# Patient Record
Sex: Female | Born: 1990 | Race: Black or African American | Hispanic: No | Marital: Married | Smoking: Never smoker
Health system: Southern US, Community
[De-identification: ages and names within clinical notes are randomized; demographics above are authoritative.]

## PROBLEM LIST (undated history)

## (undated) ENCOUNTER — Inpatient Hospital Stay
Discharge: 2017-02-04 | Disposition: A | Discharge status: Home / Self Care | Payer: MEDICAID | Attending: Emergency Medicine

## (undated) DIAGNOSIS — O021 Missed abortion: Secondary | ICD-10-CM

## (undated) DIAGNOSIS — O200 Threatened abortion: Secondary | ICD-10-CM

## (undated) DIAGNOSIS — Z3200 Encounter for pregnancy test, result unknown: Secondary | ICD-10-CM

## (undated) DIAGNOSIS — O26852 Spotting complicating pregnancy, second trimester: Secondary | ICD-10-CM

## (undated) DIAGNOSIS — O034 Incomplete spontaneous abortion without complication: Secondary | ICD-10-CM

## (undated) DIAGNOSIS — O360190 Maternal care for anti-D [Rh] antibodies, unspecified trimester, not applicable or unspecified: Secondary | ICD-10-CM

## (undated) DIAGNOSIS — J45909 Unspecified asthma, uncomplicated: Secondary | ICD-10-CM

## (undated) DIAGNOSIS — D649 Anemia, unspecified: Secondary | ICD-10-CM

## (undated) DIAGNOSIS — E039 Hypothyroidism, unspecified: Secondary | ICD-10-CM

---

## 2015-01-11 LAB — AMB EXT LDL-C: LDL-C, External: 102

## 2015-10-26 DIAGNOSIS — N938 Other specified abnormal uterine and vaginal bleeding: Secondary | ICD-10-CM

## 2015-10-26 NOTE — ED Notes (Signed)
Intermittent vaginal bleeding for approx 2 weeks.  Denies pain at the present time.  POC preg. Test negative

## 2015-10-26 NOTE — ED Notes (Signed)
I have reviewed discharge instructions with the patient.  The patient verbalized understanding.

## 2015-10-26 NOTE — ED Triage Notes (Signed)
Pt in c/o intermittant vaginal bleeding since November 18. Pt states she had her last normal period on November 10 and ended on November 14. Pt states she is spotting some days and not on others. Pt states she is RH negative. Pt states she took pregnancy test and it was negative. Pt denies pain.

## 2015-10-26 NOTE — ED Provider Notes (Signed)
HPI Comments: With last menstrual period November 10.  Has had irregular spotting since then.  Off-and-on.  Denies any pain.  Is on thyroid medication.    Patient is a 24 y.o. female presenting with vaginal bleeding. The history is provided by the patient. No language interpreter was used.   Vaginal Bleeding   This is a new problem. The current episode started 2 days ago. The problem occurs daily. The problem has been resolved. Pertinent negatives include no chest pain, no abdominal pain, no headaches and no shortness of breath. Nothing aggravates the symptoms. Nothing relieves the symptoms. She has tried nothing for the symptoms.        Past Medical History:   Diagnosis Date   ??? Adult hypothyroidism 09/11/2015   ??? Asthma 09/11/2015   ??? BMI 28.0-28.9,adult    ??? Chest pressure    ??? Cough    ??? Dizziness    ??? Fatigue 09/11/2015   ??? Head ache    ??? Left thumb sprain    ??? Sinusitis    ??? Thrombocytopenia (HCC) 09/11/2015       Past Surgical History:   Procedure Laterality Date   ??? Hx cesarean section  2015     X1          Family History:   Problem Relation Age of Onset   ??? Hypertension Mother    ??? Elevated Lipids Brother        Social History     Social History   ??? Marital status: SINGLE     Spouse name: N/A   ??? Number of children: N/A   ??? Years of education: N/A     Occupational History   ??? Not on file.     Social History Main Topics   ??? Smoking status: Never Smoker   ??? Smokeless tobacco: Not on file   ??? Alcohol use No   ??? Drug use: No   ??? Sexual activity: Yes     Other Topics Concern   ??? Not on file     Social History Narrative         ALLERGIES: Suprax [cefixime]    Review of Systems   Constitutional: Negative for chills and fever.   Eyes: Negative for pain and redness.   Respiratory: Negative for chest tightness, shortness of breath and wheezing.    Cardiovascular: Negative for chest pain and leg swelling.   Gastrointestinal: Negative for abdominal pain, diarrhea, nausea and vomiting.    Genitourinary: Positive for vaginal bleeding. Negative for dysuria and vaginal discharge.   Musculoskeletal: Negative for back pain, gait problem, neck pain and neck stiffness.   Skin: Negative for color change and rash.   Neurological: Negative for weakness, numbness and headaches.       Vitals:    10/26/15 2007   BP: 145/73   Pulse: 95   Resp: 19   Temp: 98.2 ??F (36.8 ??C)   SpO2: 100%   Weight: 81.6 kg (180 lb)   Height: 5\' 2"  (1.575 m)            Physical Exam   Constitutional: She is oriented to person, place, and time. She appears well-developed and well-nourished. No distress.   HENT:   Head: Normocephalic and atraumatic.   Neck: Normal range of motion. Neck supple.   Cardiovascular: Normal rate and regular rhythm.    No murmur heard.  Pulmonary/Chest: Effort normal and breath sounds normal. She has no wheezes.   Abdominal: Soft. Bowel sounds  are normal. There is no tenderness.   Musculoskeletal: Normal range of motion. She exhibits no edema.   Neurological: She is alert and oriented to person, place, and time.   Skin: Skin is warm and dry.   Nursing note and vitals reviewed.       MDM  Number of Diagnoses or Management Options  Diagnosis management comments: DUB possibly due to thyroid meds. Will refer to OB/GYN.     Patient Progress  Patient progress: stable    ED Course       Procedures    UA neg. UPT neg.

## 2015-10-27 ENCOUNTER — Inpatient Hospital Stay
Admit: 2015-10-27 | Discharge: 2015-10-27 | Disposition: A | Discharge status: Home / Self Care | Payer: MEDICAID | Attending: Emergency Medicine

## 2015-10-27 LAB — HCG URINE, QL. - POC: Pregnancy test,urine (POC): NEGATIVE

## 2016-04-11 NOTE — Telephone Encounter (Signed)
Patient's phone number is disconnected I could not reschedule her appointment for 04/16/16 Dr. Dalene Carrowdogwu is on vacation.

## 2016-04-16 ENCOUNTER — Encounter: Attending: Obstetrics & Gynecology

## 2016-04-26 ENCOUNTER — Ambulatory Visit: Admit: 2016-04-26 | Discharge: 2016-04-26 | Payer: MEDICAID | Attending: Obstetrics & Gynecology

## 2016-04-26 DIAGNOSIS — Z3492 Encounter for supervision of normal pregnancy, unspecified, second trimester: Secondary | ICD-10-CM

## 2016-04-26 LAB — CBC WITH AUTOMATED DIFF
ABS. GRANULOCYTES: 6.9 10*3/uL (ref 2.0–7.8)
ABS. LYMPHOCYTES: 2.9 10*3/uL (ref 0.6–4.1)
ABSOLUTE MID: 1.2 10*3/uL (ref 0.0–1.8)
GRANULOCYTES: 62.4 % (ref 37.0–92.0)
HCT: 38.5 % (ref 38.0–48.0)
HGB: 12.2 g/dL (ref 11.5–14.5)
LYMPHOCYTES: 26.8 % (ref 10.0–58.8)
MCH: 26.8 pg — ABNORMAL LOW (ref 28.0–35.0)
MCHC: 31.7 g/dL (ref 31.0–36.0)
MCV: 84.6 fL — ABNORMAL LOW (ref 85.4–98.6)
MEAN PLATELET VOLUME: 8.3 fl (ref 0.0–49.9)
MID %: 10.8 % (ref 0.1–15.0)
PLATELET: 201 10*3/uL (ref 150–350)
RBC: 4.55 10*6/uL (ref 4.20–6.30)
RDW: 17.3 % — ABNORMAL HIGH (ref 11.5–14.5)
WBC: 11 10*3/uL — ABNORMAL HIGH (ref 4.1–10.9)

## 2016-04-26 LAB — AMB POC URINALYSIS DIP STICK AUTO W/O MICRO
Bilirubin (UA POC): NEGATIVE
Blood (UA POC): NEGATIVE
Glucose (UA POC): NEGATIVE
Ketones (UA POC): NEGATIVE
Nitrites (UA POC): NEGATIVE
Protein (UA POC): NEGATIVE mg/dL
Specific gravity (UA POC): 1.01 (ref 1.001–1.035)
Urobilinogen (UA POC): NORMAL (ref 0.2–1)
pH (UA POC): 5 (ref 4.6–8.0)

## 2016-04-26 NOTE — Addendum Note (Signed)
Addended byOlin Hauser: Sharone Picchi on: 04/26/2016 04:30 PM      Modules accepted: Orders

## 2016-04-26 NOTE — Progress Notes (Signed)
Prenatal Visit    Ms. Deborah Barker is a 25 y.o. female with an estimated gestational age of [redacted] weeks 3 days with Estimated Date of Delivery: 08/06/16, here for a prenatal visit.   She denies any problems since her last visit to the office. She c/o  pelvic pressure.   Patient denies contractions, vaginal bleeding , vaginal leaking of fluid     She continues to take her prenatal vitamins.    States that the baby has been active.     O:   Vitals:    04/26/16 1558   BP: 130/80   Weight: 197 lb (89.4 kg)      GENERAL APPEARANCE: alert, well appearing, in no apparent distress   Fundus soft, non-tender compatible with gestational age   Cervical exam: not done       A/P:     ICD-10-CM ICD-9-CM    1. Prenatal care in second trimester Z34.92 V22.1 AMB POC URINALYSIS DIP STICK AUTO W/O MICRO      GLUCOSE TOLERANCE, 2 HR   2. Anemia complicating pregnancy in third trimester O99.013 648.23 CBC WITH AUTOMATED DIFF     285.9    3. Thyroid dysfunction in pregnancy, antepartum, third trimester O99.283 648.13 TSH 3RD GENERATION    E07.9 246.9 T4, FREE   .   OB care - Counseled patient about events that should prompt her to go to the Hospital   Follow up in 3 weeks

## 2016-04-29 LAB — T4, FREE: T4, Free: 0.92 ng/dL (ref 0.59–1.17)

## 2016-04-29 LAB — GLUCOSE TOLERANCE, 2 HR
GLUCOSE, FASTING: 80 mg/dl (ref 73–118)
Glucose - 1 hour: 77 mg/dL (ref 73.00–118.00)

## 2016-04-29 LAB — TSH 3RD GENERATION: TSH: 2.66 ng/dL (ref 0.40–4.50)

## 2016-05-21 ENCOUNTER — Inpatient Hospital Stay: Payer: MEDICAID

## 2016-05-21 ENCOUNTER — Ambulatory Visit: Admit: 2016-05-21 | Discharge: 2016-05-21 | Payer: MEDICAID | Attending: Obstetrics & Gynecology

## 2016-05-21 DIAGNOSIS — Z3493 Encounter for supervision of normal pregnancy, unspecified, third trimester: Secondary | ICD-10-CM

## 2016-05-21 LAB — TYPE AND SCREEN
ABO/Rh: A NEG
Antibody Screen: NEGATIVE

## 2016-05-21 LAB — AMB POC URINALYSIS DIP STICK AUTO W/O MICRO
Bilirubin (UA POC): NEGATIVE
Blood (UA POC): NEGATIVE
Glucose (UA POC): NEGATIVE
Ketones (UA POC): NEGATIVE
Leukocyte esterase (UA POC): NEGATIVE
Nitrites (UA POC): NEGATIVE
Protein (UA POC): NEGATIVE mg/dL
Specific gravity (UA POC): 1.03 (ref 1.001–1.035)
Urobilinogen (UA POC): NORMAL (ref 0.2–1)
pH (UA POC): 5 (ref 4.6–8.0)

## 2016-05-21 LAB — TYPE & SCREEN
ABO/Rh(D): A NEG
Antibody screen: NEGATIVE

## 2016-05-21 MED ORDER — RHO D IMMUNE GLOBULIN 300 MCG IM SYRG
1500 unit (300 mcg) | Freq: Once | INTRAMUSCULAR | Status: AC
Start: 2016-05-21 — End: 2016-05-21
  Administered 2016-05-21: 23:00:00 via INTRAMUSCULAR

## 2016-05-21 MED FILL — HYPERRHO S/D 1,500 UNIT (300 MCG) INTRAMUSCULAR SYRINGE: 1500 unit (300 mcg) | INTRAMUSCULAR | Qty: 1

## 2016-05-21 NOTE — Progress Notes (Signed)
Pt received rhogam Im right hip. Pt is stable at time of discharge

## 2016-05-21 NOTE — Progress Notes (Signed)
Prenatal Visit    Ms. Deborah Barker is a 25 y.o. female with an estimated gestational age of 2150w0d with Estimated Date of Delivery: 08/06/16, here for a prenatal visit.   She denies any problems since her last visit to the office. She c/o  pelvic pressure.   Patient denies contractions, vaginal bleeding , vaginal leaking of fluid     She continues to take her prenatal vitamins.    States that the baby has been active.     O:   Vitals:    05/21/16 1509   BP: 122/80   Weight: 198 lb (89.8 kg)      GENERAL APPEARANCE: alert, well appearing, in no apparent distress   Fundus soft, non-tender compatible with gestational age   Cervical exam: not done       A/P:     ICD-10-CM ICD-9-CM    1. Prenatal care in third trimester Z34.93 V22.1 AMB POC URINALYSIS DIP STICK AUTO W/O MICRO   2. Anemia complicating pregnancy in third trimester O99.013 648.23      285.9    .   OB care - Counseled patient about events that should prompt her to go to the Hospital   Follow up in 2 weeks              Antibody scree/rhogam today.              Emphasized importance of continuing ferrous sulphate supplements.

## 2016-05-21 NOTE — Progress Notes (Signed)
Antibody screen drawn and sent to lab.

## 2016-06-04 ENCOUNTER — Ambulatory Visit: Admit: 2016-06-04 | Discharge: 2016-06-04 | Payer: MEDICAID | Attending: Obstetrics & Gynecology

## 2016-06-04 DIAGNOSIS — Z3493 Encounter for supervision of normal pregnancy, unspecified, third trimester: Secondary | ICD-10-CM

## 2016-06-04 LAB — AMB POC URINALYSIS DIP STICK AUTO W/O MICRO
Blood (UA POC): NEGATIVE
Glucose (UA POC): NEGATIVE
Ketones (UA POC): NEGATIVE
Leukocyte esterase (UA POC): NEGATIVE
Nitrites (UA POC): NEGATIVE
Specific gravity (UA POC): 1.02 (ref 1.001–1.035)
Urobilinogen (UA POC): 1 (ref 0.2–1)
pH (UA POC): 5 (ref 4.6–8.0)

## 2016-06-04 MED ORDER — LEVOTHYROXINE 75 MCG TAB
75 mcg | ORAL_TABLET | Freq: Every day | ORAL | 2 refills | Status: DC
Start: 2016-06-04 — End: 2017-01-22

## 2016-06-04 NOTE — Progress Notes (Signed)
Prenatal Visit    Ms. Deborah Barker is a 25 y.o. female with an estimated gestational age of 6581w0d with Estimated Date of Delivery: 08/06/16, here for a prenatal visit.   She denies any problems since her last visit to the office. She c/o  pelvic pressure.   Patient denies contractions, vaginal bleeding , vaginal leaking of fluid     She continues to take her prenatal vitamins.    States that the baby has been active.     O:   Vitals:    06/04/16 1515   BP: 122/76   Weight: 200 lb 6.4 oz (90.9 kg)      GENERAL APPEARANCE: alert, well appearing, in no apparent distress   Fundus soft, non-tender compatible with gestational age   Cervical exam: not done       A/P:     ICD-10-CM ICD-9-CM    1. Prenatal care, third trimester Z34.93 V22.1 AMB POC URINALYSIS DIP STICK AUTO W/O MICRO   2. Anemia complicating pregnancy in third trimester O99.013 648.23      285.9    3. Thyroid dysfunction in pregnancy, antepartum, third trimester O99.283 648.13 levothyroxine (SYNTHROID) 75 mcg tablet    E07.9 246.9    .   OB care - Counseled patient about events that should prompt her to go to the Hospital   Follow up in 2 weeks

## 2016-06-18 ENCOUNTER — Encounter: Payer: MEDICAID | Attending: Obstetrics & Gynecology

## 2016-06-21 ENCOUNTER — Ambulatory Visit: Admit: 2016-06-21 | Discharge: 2016-06-21 | Payer: MEDICAID | Attending: Obstetrics & Gynecology

## 2016-06-21 DIAGNOSIS — Z3493 Encounter for supervision of normal pregnancy, unspecified, third trimester: Secondary | ICD-10-CM

## 2016-06-21 LAB — AMB POC URINALYSIS DIP STICK AUTO W/O MICRO
Bilirubin (UA POC): NEGATIVE
Blood (UA POC): NEGATIVE
Glucose (UA POC): NEGATIVE
Ketones (UA POC): NEGATIVE
Leukocyte esterase (UA POC): NEGATIVE
Nitrites (UA POC): NEGATIVE
Protein (UA POC): NEGATIVE mg/dL
Specific gravity (UA POC): 1.01 (ref 1.001–1.035)
Urobilinogen (UA POC): 0.2 (ref 0.2–1)
pH (UA POC): 6 (ref 4.6–8.0)

## 2016-06-21 NOTE — Progress Notes (Signed)
Prenatal Visit    Ms. Loyal is a 25 y.o. female with an estimated gestational age of [redacted]w[redacted]d with Estimated Date of Delivery: 08/06/16, here for a prenatal visit.   She denies any problems since her last visit to the office. She c/o  pelvic pressure.   Patient denies contractions, vaginal bleeding , vaginal leaking of fluid     She continues to take her prenatal vitamins.    States that the baby has been active.     O:   Vitals:    06/21/16 1301   BP: 120/70   Weight: 197 lb (89.4 kg)      GENERAL APPEARANCE: alert, well appearing, in no apparent distress   Fundus soft, non-tender compatible with gestational age   Cervical exam: not done       A/P:     ICD-10-CM ICD-9-CM    1. Prenatal care in third trimester Z34.93 V22.1 AMB POC URINALYSIS DIP STICK AUTO W/O MICRO   2. Anemia complicating pregnancy in third trimester O99.013 648.23      285.9    .   OB care - Counseled patient about events that should prompt her to go to the Hospital   Follow up in 2 weeks              Emphasized importance of continuing ferrous supplements.

## 2016-07-09 ENCOUNTER — Ambulatory Visit: Admit: 2016-07-09 | Discharge: 2016-07-09 | Payer: MEDICAID | Attending: Obstetrics & Gynecology

## 2016-07-09 DIAGNOSIS — Z3493 Encounter for supervision of normal pregnancy, unspecified, third trimester: Secondary | ICD-10-CM

## 2016-07-09 LAB — AMB POC URINALYSIS DIP STICK AUTO W/O MICRO
Bilirubin (UA POC): NEGATIVE
Blood (UA POC): NEGATIVE
Glucose (UA POC): NEGATIVE
Leukocyte esterase (UA POC): NEGATIVE
Nitrites (UA POC): NEGATIVE
Protein (UA POC): NEGATIVE mg/dL
Specific gravity (UA POC): 1.015 (ref 1.001–1.035)
Urobilinogen (UA POC): 0.2 (ref 0.2–1)
pH (UA POC): 5 (ref 4.6–8.0)

## 2016-07-09 NOTE — Progress Notes (Signed)
Prenatal Visit    Ms. Deborah Barker is a 25 y.o. female with an estimated gestational age of 7834w0d with Estimated Date of Delivery: 08/06/16, here for a prenatal visit.   She denies any problems since her last visit to the office. She c/o  pelvic pressure.   Patient denies contractions, vaginal bleeding , vaginal leaking of fluid     She continues to take her prenatal vitamins.    States that the baby has been active.     O:   Vitals:    07/09/16 1406   BP: 120/82   Weight: 198 lb 3.2 oz (89.9 kg)      GENERAL APPEARANCE: alert, well appearing, in no apparent distress   Fundus soft, non-tender compatible with gestational age   Cervical exam: not done       A/P:     ICD-10-CM ICD-9-CM    1. Prenatal care in third trimester Z34.93 V22.1 AMB POC URINALYSIS DIP STICK AUTO W/O MICRO   2. Anemia complicating pregnancy in third trimester O99.013 648.23      285.9    .   OB care - Counseled patient about events that should prompt her to go to the Hospital   Follow up in 1 weeks             Emphasized importance of continuing ferrous sulphate supplements.

## 2016-07-09 NOTE — Addendum Note (Signed)
Addended by: Oretha MilchSLAUGHTER, Macario Shear on: 07/09/2016 02:30 PM      Modules accepted: Orders

## 2016-07-13 LAB — CULTURE, GENITAL GROUP B STREP
STREP GP B CULTURE: POSITIVE — AB
STREP GP B CULTURE: POSITIVE — AB

## 2016-07-16 ENCOUNTER — Ambulatory Visit: Admit: 2016-07-16 | Discharge: 2016-07-16 | Payer: MEDICAID | Attending: Obstetrics & Gynecology

## 2016-07-16 DIAGNOSIS — Z3493 Encounter for supervision of normal pregnancy, unspecified, third trimester: Secondary | ICD-10-CM

## 2016-07-16 LAB — AMB POC URINALYSIS DIP STICK AUTO W/O MICRO
Bilirubin (UA POC): NEGATIVE
Blood (UA POC): NEGATIVE
Glucose (UA POC): NEGATIVE
Leukocyte esterase (UA POC): NEGATIVE
Nitrites (UA POC): NEGATIVE
Protein (UA POC): NEGATIVE mg/dL
Specific gravity (UA POC): 1.015 (ref 1.001–1.035)
Urobilinogen (UA POC): 0.2 (ref 0.2–1)
pH (UA POC): 6 (ref 4.6–8.0)

## 2016-07-16 NOTE — Progress Notes (Signed)
Prenatal Visit    Ms. Deborah Barker is a 25 y.o. female with an estimated gestational age of 686w0d with Estimated Date of Delivery: 08/06/16, here for a prenatal visit.   She denies any problems since her last visit to the office. She c/o  pelvic pressure.   Patient denies contractions, vaginal bleeding , vaginal leaking of fluid     She continues to take her prenatal vitamins.    States that the baby has been active.     O:   Vitals:    07/16/16 1424   BP: 112/78   Weight: 197 lb (89.4 kg)      GENERAL APPEARANCE: alert, well appearing, in no apparent distress   Fundus soft, non-tender compatible with gestational age   Cervical exam: not done       A/P:     ICD-10-CM ICD-9-CM    1. Prenatal care in third trimester Z34.93 V22.1 AMB POC URINALYSIS DIP STICK AUTO W/O MICRO   2. Anemia complicating pregnancy in third trimester O99.013 648.23      285.9    .   OB care - Counseled patient about events that should prompt her to go to the Hospital   Follow up in 1 weeks              Emphasized importance of continuing ferrous sulphate supplements.              Repeat C/s scheduled for 39 weeks.

## 2016-07-23 ENCOUNTER — Ambulatory Visit: Admit: 2016-07-23 | Discharge: 2016-07-23 | Payer: MEDICAID | Attending: Obstetrics & Gynecology

## 2016-07-23 DIAGNOSIS — Z3493 Encounter for supervision of normal pregnancy, unspecified, third trimester: Secondary | ICD-10-CM

## 2016-07-23 LAB — AMB POC URINALYSIS DIP STICK AUTO W/O MICRO
Bilirubin (UA POC): NEGATIVE
Blood (UA POC): NEGATIVE
Glucose (UA POC): NEGATIVE
Ketones (UA POC): NEGATIVE
Leukocyte esterase (UA POC): NEGATIVE
Nitrites (UA POC): NEGATIVE
Protein (UA POC): NEGATIVE mg/dL
Specific gravity (UA POC): 1.02 (ref 1.001–1.035)
Urobilinogen (UA POC): 0.2 (ref 0.2–1)
pH (UA POC): 5 (ref 4.6–8.0)

## 2016-07-23 NOTE — Progress Notes (Signed)
Prenatal Visit    Ms. Deborah Barker is a 25 y.o. female with an estimated gestational age of 4977w0d with Estimated Date of Delivery: 08/06/16, here for a prenatal visit.   She denies any problems since her last visit to the office. She c/o  pelvic pressure.   Patient denies contractions, vaginal bleeding , vaginal leaking of fluid     She continues to take her prenatal vitamins.    States that the baby has been active.     O:   Vitals:    07/23/16 1412   BP: 110/82   Weight: 199 lb (90.3 kg)      GENERAL APPEARANCE: alert, well appearing, in no apparent distress   Fundus soft, non-tender compatible with gestational age   Cervical exam: not done       A/P:     ICD-10-CM ICD-9-CM    1. Prenatal care in third trimester Z34.93 V22.1 AMB POC URINALYSIS DIP STICK AUTO W/O MICRO   2. Previous cesarean delivery affecting pregnancy O34.219 654.20    .   OB care - Counseled patient about events that should prompt her to go to the Hospital   C/s scheduled for next week.

## 2016-07-24 NOTE — Progress Notes (Signed)
Patient ID verified.  Allergies, medical history, prenatal record and prior to admission medications verified.  Pt instructed to be NPO after midnight.  Pt instructed to arrive at hospital @1045,come to entrance C and sign in at the registration desk on the 4th floor. Patient instructed to come to hospital sooner if SROM, labor, or concerning symptoms. Patient verbalized understanding. Questions encouraged and answered.        Patient's prenatal record and scheduled delivery form have been scanned into connect care.  Results console and orders have been placed in connect care.

## 2016-07-25 NOTE — Progress Notes (Signed)
Patient moved to 08/01/2016 @ 1300. Patient notified and verbalizes understanding.

## 2016-08-01 ENCOUNTER — Encounter

## 2016-08-01 ENCOUNTER — Inpatient Hospital Stay
Admit: 2016-08-01 | Discharge: 2016-08-04 | Disposition: A | Discharge status: Home / Self Care | Payer: MEDICAID | Attending: Obstetrics & Gynecology | Admitting: Obstetrics & Gynecology

## 2016-08-01 DIAGNOSIS — O34219 Maternal care for unspecified type scar from previous cesarean delivery: Secondary | ICD-10-CM

## 2016-08-01 LAB — TYPE AND SCREEN
ABO/Rh: A NEG
Antibody Screen: NEGATIVE

## 2016-08-01 LAB — CBC W/O DIFF
HCT: 38.9 % (ref 35.8–46.3)
HGB: 12.9 g/dL (ref 11.7–15.4)
MCH: 27.2 PG (ref 26.1–32.9)
MCHC: 33.2 g/dL (ref 31.4–35.0)
MCV: 82.1 FL (ref 79.6–97.8)
MPV: 12.2 FL (ref 10.8–14.1)
PLATELET: 130 10*3/uL — ABNORMAL LOW (ref 150–450)
RBC: 4.74 M/uL (ref 4.05–5.25)
RDW: 15.2 % — ABNORMAL HIGH (ref 11.9–14.6)
WBC: 7.8 10*3/uL (ref 4.3–11.1)

## 2016-08-01 LAB — CORD BLOOD GAS VENOUS
BASE DEFICIT,CBV: 1.5 mmol/L — ABNORMAL LOW (ref 1.9–7.7)
HCO3,CORD BLD VENOUS: 22 mmol/L
PCO2,CORD BLD VENOUS: 35 mmHg (ref 14.1–43.3)
PH,CORD BLD VENOUS: 7.423 (ref 7.2–7.44)
PO2,CORD BLD VENOUS: 32 mmHg (ref 30.4–57.2)

## 2016-08-01 LAB — RT--CORD BLOOD GAS
BASE DEFICIT,CBA: 0.1 mmol/L (ref 0.0–2.0)
HCO3,CORD BLD ARTERIAL: 26 mmol/L (ref 22–26)
PCO2,CORD BLD ARTERIAL: 46 mmHg (ref 33–49)
PH,CORD BLD ARTERIAL: 7.364 — ABNORMAL HIGH (ref 7.21–7.31)
PO2,CORD BLD ARTERIAL: 19 mmHg (ref 9–19)

## 2016-08-01 LAB — TYPE & SCREEN
ABO/Rh(D): A NEG
Antibody screen: NEGATIVE

## 2016-08-01 MED ORDER — ONDANSETRON (PF) 4 MG/2 ML INJECTION
4 mg/2 mL | INTRAMUSCULAR | Status: AC
Start: 2016-08-01 — End: ?

## 2016-08-01 MED ORDER — SODIUM CHLORIDE 0.9 % IJ SYRG
INTRAMUSCULAR | Status: DC | PRN
Start: 2016-08-01 — End: 2016-08-01

## 2016-08-01 MED ORDER — KETOROLAC TROMETHAMINE 30 MG/ML INJECTION
30 mg/mL (1 mL) | INTRAMUSCULAR | Status: DC | PRN
Start: 2016-08-01 — End: 2016-08-01
  Administered 2016-08-01: 18:00:00 via INTRAVENOUS

## 2016-08-01 MED ORDER — LACTATED RINGERS IV
INTRAVENOUS | Status: DC
Start: 2016-08-01 — End: 2016-08-01

## 2016-08-01 MED ORDER — OXYTOCIN 30 UNIT IN 500 ML INFUSION
30 unit/500 mL | Freq: Once | INTRAVENOUS | Status: DC
Start: 2016-08-01 — End: 2016-08-01

## 2016-08-01 MED ORDER — SODIUM CHLORIDE 0.9 % IJ SYRG
Freq: Three times a day (TID) | INTRAMUSCULAR | Status: DC
Start: 2016-08-01 — End: 2016-08-04
  Administered 2016-08-04: 02:00:00 via INTRAVENOUS

## 2016-08-01 MED ORDER — SODIUM CHLORIDE 0.9 % INJECTION
10 mg/mL | INTRAMUSCULAR | Status: DC | PRN
Start: 2016-08-01 — End: 2016-08-01
  Administered 2016-08-01: 17:00:00 via INTRAVENOUS

## 2016-08-01 MED ORDER — SODIUM CITRATE-CITRIC ACID 500 MG-334 MG/5 ML ORAL SOLN
500-334 mg/5 mL | Freq: Once | ORAL | Status: AC
Start: 2016-08-01 — End: 2016-08-01
  Administered 2016-08-01: 17:00:00 via ORAL

## 2016-08-01 MED ORDER — PHENYLEPHRINE 10 MG/ML INJECTION
10 mg/mL | INTRAMUSCULAR | Status: AC
Start: 2016-08-01 — End: ?

## 2016-08-01 MED ORDER — OXYCODONE 5 MG TAB
5 mg | Freq: Four times a day (QID) | ORAL | Status: DC | PRN
Start: 2016-08-01 — End: 2016-08-02

## 2016-08-01 MED ORDER — HYDROMORPHONE (PF) 1 MG/ML IJ SOLN
1 mg/mL | INTRAMUSCULAR | Status: DC | PRN
Start: 2016-08-01 — End: 2016-08-02

## 2016-08-01 MED ORDER — BUPIVACAINE-DEXTROSE-WATER(PF) 7.5 MG/ML (0.75 %) (SPINAL) IJ SOLN
0.75 % (7.5 mg/mL) | INTRAMUSCULAR | Status: AC
Start: 2016-08-01 — End: ?

## 2016-08-01 MED ORDER — LACTATED RINGERS IV
INTRAVENOUS | Status: DC | PRN
Start: 2016-08-01 — End: 2016-08-01
  Administered 2016-08-01 (×2): via INTRAVENOUS

## 2016-08-01 MED ORDER — MORPHINE (PF) 0.5 MG/ML IJ SOLN
0.5 mg/mL | INTRAMUSCULAR | Status: DC | PRN
Start: 2016-08-01 — End: 2016-08-01
  Administered 2016-08-01: 17:00:00 via INTRATHECAL

## 2016-08-01 MED ORDER — FENTANYL CITRATE (PF) 50 MCG/ML IJ SOLN
50 mcg/mL | INTRAMUSCULAR | Status: DC | PRN
Start: 2016-08-01 — End: 2016-08-01
  Administered 2016-08-01: 17:00:00 via INTRATHECAL

## 2016-08-01 MED ORDER — DEXTROSE 5%-LACTATED RINGERS IV
INTRAVENOUS | Status: DC
Start: 2016-08-01 — End: 2016-08-01

## 2016-08-01 MED ORDER — HYDROMORPHONE (PF) 2 MG/ML IJ SOLN
2 mg/mL | INTRAMUSCULAR | Status: DC | PRN
Start: 2016-08-01 — End: 2016-08-02

## 2016-08-01 MED ORDER — EPHEDRINE SULFATE 50 MG/ML IJ SOLN
50 mg/mL | INTRAMUSCULAR | Status: AC
Start: 2016-08-01 — End: ?

## 2016-08-01 MED ORDER — KETOROLAC TROMETHAMINE 30 MG/ML INJECTION
30 mg/mL (1 mL) | INTRAMUSCULAR | Status: AC
Start: 2016-08-01 — End: ?

## 2016-08-01 MED ORDER — HYDROCODONE-ACETAMINOPHEN 5 MG-325 MG TAB
5-325 mg | ORAL | Status: DC | PRN
Start: 2016-08-01 — End: 2016-08-02

## 2016-08-01 MED ORDER — ONDANSETRON (PF) 4 MG/2 ML INJECTION
4 mg/2 mL | Freq: Four times a day (QID) | INTRAMUSCULAR | Status: DC | PRN
Start: 2016-08-01 — End: 2016-08-02
  Administered 2016-08-01: 23:00:00 via INTRAVENOUS

## 2016-08-01 MED ORDER — OXYTOCIN 30 UNIT IN 500 ML INFUSION
30 unit/500 mL | INTRAVENOUS | Status: DC | PRN
Start: 2016-08-01 — End: 2016-08-01
  Administered 2016-08-01: 18:00:00 via INTRAVENOUS

## 2016-08-01 MED ORDER — LACTATED RINGERS IV
INTRAVENOUS | Status: DC
Start: 2016-08-01 — End: 2016-08-02

## 2016-08-01 MED ORDER — BUPIVACAINE-DEXTROSE-WATER(PF) 7.5 MG/ML (0.75 %) (SPINAL) IJ SOLN
0.75 % (7.5 mg/mL) | INTRAMUSCULAR | Status: DC | PRN
Start: 2016-08-01 — End: 2016-08-01
  Administered 2016-08-01: 17:00:00 via INTRATHECAL

## 2016-08-01 MED ORDER — CEFAZOLIN 2 GRAM/50 ML NS IVPB
Freq: Once | INTRAVENOUS | Status: AC
Start: 2016-08-01 — End: 2016-08-02

## 2016-08-01 MED ORDER — ONDANSETRON (PF) 4 MG/2 ML INJECTION
4 mg/2 mL | INTRAMUSCULAR | Status: DC | PRN
Start: 2016-08-01 — End: 2016-08-01
  Administered 2016-08-01: 18:00:00 via INTRAVENOUS

## 2016-08-01 MED ORDER — OXYCODONE 5 MG TAB
5 mg | Freq: Once | ORAL | Status: DC | PRN
Start: 2016-08-01 — End: 2016-08-02

## 2016-08-01 MED ORDER — SODIUM CHLORIDE 0.9 % IJ SYRG
Freq: Three times a day (TID) | INTRAMUSCULAR | Status: DC
Start: 2016-08-01 — End: 2016-08-01

## 2016-08-01 MED ORDER — FAMOTIDINE (PF) 20 MG/2 ML IV
20 mg/2 mL | Freq: Once | INTRAVENOUS | Status: AC
Start: 2016-08-01 — End: 2016-08-01
  Administered 2016-08-01: 17:00:00 via INTRAVENOUS

## 2016-08-01 MED ORDER — DIPHTH,PERTUS(AC)TETANUS VAC(PF) 2.5 LF UNIT-8 MCG-5 LF/0.5 ML INJ
INTRAMUSCULAR | Status: DC
Start: 2016-08-01 — End: 2016-08-01

## 2016-08-01 MED ORDER — MORPHINE (PF) 0.5 MG/ML IJ SOLN
0.5 mg/mL | INTRAMUSCULAR | Status: AC
Start: 2016-08-01 — End: ?

## 2016-08-01 MED ORDER — NALOXONE 0.4 MG/ML INJECTION
0.4 mg/mL | Freq: Once | INTRAMUSCULAR | Status: DC | PRN
Start: 2016-08-01 — End: 2016-08-02

## 2016-08-01 MED ORDER — KETOROLAC TROMETHAMINE 30 MG/ML INJECTION
30 mg/mL (1 mL) | Freq: Four times a day (QID) | INTRAMUSCULAR | Status: DC | PRN
Start: 2016-08-01 — End: 2016-08-02
  Administered 2016-08-02: 05:00:00 via INTRAVENOUS

## 2016-08-01 MED ORDER — CEFAZOLIN 1 GRAM SOLUTION FOR INJECTION
1 gram | INTRAMUSCULAR | Status: DC | PRN
Start: 2016-08-01 — End: 2016-08-01
  Administered 2016-08-01: 17:00:00 via INTRAVENOUS

## 2016-08-01 MED ORDER — FENTANYL CITRATE (PF) 50 MCG/ML IJ SOLN
50 mcg/mL | INTRAMUSCULAR | Status: AC
Start: 2016-08-01 — End: ?

## 2016-08-01 MED ORDER — SODIUM CHLORIDE 0.9 % IJ SYRG
INTRAMUSCULAR | Status: DC | PRN
Start: 2016-08-01 — End: 2016-08-04

## 2016-08-01 MED ORDER — SODIUM CHLORIDE 0.9 % INJECTION
25 mg/mL | Freq: Four times a day (QID) | INTRAMUSCULAR | Status: DC | PRN
Start: 2016-08-01 — End: 2016-08-02
  Administered 2016-08-02: via INTRAVENOUS

## 2016-08-01 MED FILL — KETOROLAC TROMETHAMINE 30 MG/ML INJECTION: 30 mg/mL (1 mL) | INTRAMUSCULAR | Qty: 1

## 2016-08-01 MED FILL — HYDROMORPHONE (PF) 2 MG/ML IJ SOLN: 2 mg/mL | INTRAMUSCULAR | Qty: 1

## 2016-08-01 MED FILL — ONDANSETRON (PF) 4 MG/2 ML INJECTION: 4 mg/2 mL | INTRAMUSCULAR | Qty: 2

## 2016-08-01 MED FILL — ONDANSETRON (PF) 4 MG/2 ML INJECTION: 4 mg/2 mL | INTRAMUSCULAR | Qty: 4

## 2016-08-01 MED FILL — MARCAINE SPINAL (PF) 0.75 % (7.5 MG/ML) INJECTION SOLUTION: 0.75 % (7.5 mg/mL) | INTRAMUSCULAR | Qty: 1.8

## 2016-08-01 MED FILL — FAMOTIDINE (PF) 20 MG/2 ML IV: 20 mg/2 mL | INTRAVENOUS | Qty: 2

## 2016-08-01 MED FILL — PHENYLEPHRINE 10 MG/ML INJECTION: 10 mg/mL | INTRAMUSCULAR | Qty: 100

## 2016-08-01 MED FILL — PHENYLEPHRINE 10 MG/ML INJECTION: 10 mg/mL | INTRAMUSCULAR | Qty: 1

## 2016-08-01 MED FILL — MORPHINE (PF) 0.5 MG/ML IJ SOLN: 0.5 mg/mL | INTRAMUSCULAR | Qty: 10

## 2016-08-01 MED FILL — MARCAINE SPINAL (PF) 0.75 % (7.5 MG/ML) INJECTION SOLUTION: 0.75 % (7.5 mg/mL) | INTRAMUSCULAR | Qty: 2

## 2016-08-01 MED FILL — FENTANYL CITRATE (PF) 50 MCG/ML IJ SOLN: 50 mcg/mL | INTRAMUSCULAR | Qty: 2

## 2016-08-01 MED FILL — EPHEDRINE SULFATE 50 MG/ML IJ SOLN: 50 mg/mL | INTRAMUSCULAR | Qty: 1

## 2016-08-01 MED FILL — LACTATED RINGERS IV: INTRAVENOUS | Qty: 2000

## 2016-08-01 MED FILL — OXYTOCIN 30 UNIT IN 500 ML INFUSION: 30 unit/500 mL | INTRAVENOUS | Qty: 500

## 2016-08-01 MED FILL — SODIUM CITRATE-CITRIC ACID 500 MG-334 MG/5 ML ORAL SOLN: 500-334 mg/5 mL | ORAL | Qty: 2

## 2016-08-01 MED FILL — CEFAZOLIN 1 GRAM SOLUTION FOR INJECTION: 1 gram | INTRAMUSCULAR | Qty: 2

## 2016-08-01 NOTE — Anesthesia Procedure Notes (Signed)
Spinal Block    Start time: 08/01/2016 1:12 PM  End time: 08/01/2016 1:13 PM  Performed by: Derry LoryHORTON JR, Hartley Wyke H  Authorized by: Delman KittenHORTON JR, Shandrea Lusk H     Pre-procedure:  Indications: at surgeon's request and primary anesthetic  Preanesthetic Checklist: patient identified, risks and benefits discussed, anesthesia consent, site marked, patient being monitored and timeout performed    Timeout Time: 13:11          Spinal Block:   Patient Position:  Seated  Prep Region:  Lumbar  Prep: DuraPrep      Location:  L2-3  Technique:  Single shot  Local:  Lidocaine 1%  Local Dose (mL):  3    Needle:   Needle Type:  Pencan  Needle Gauge:  25 G  Attempts:  1      Events: CSF confirmed, no blood with aspiration and no paresthesia        Assessment:  Insertion:  Uncomplicated  Patient tolerance:  Patient tolerated the procedure well with no immediate complications

## 2016-08-01 NOTE — Op Note (Signed)
Cesarean Section Delivery Operative Report     Date of Surgery: 08/01/2016     Preoperative Diagnosis: H/O cesarean section [Z98.891]  [redacted] weeks gestation of pregnancy [Z3A.39]    Postoperative Diagnosis: same with viable infant    Procedure: Procedure(s):  CESAREAN SECTION    Surgeon(s) and Role:     * Cranston Neighbor, MD - Primary     Anesthesia: Spinal    Procedure Detail:    The patient was taken to the operating room, where spinal anesthesia was found to be adequate.  The patient was prepped and draped in the normal sterile fashion. Pfannenstiel skin incision was made with the scalpel and carried down to the underlying fascia. The fascial incision was extended laterally with Mayo scissors. This fascial incision was grasped with Kocher clamps, tented up, and the underlying rectus muscles were dissected bluntly. The inferior edge of the rectus fascia was grasped with Kocher clamps, tented up, and the underlying rectus muscle was dissected off bluntly. The rectus muscle was divided in the midline bluntly. The peritoneum was entered bluntly with hemostat and extended inferiorly and superiorly with Metzenbaum scissors. The bladder blade was then inserted. The vesicouterine and peritoneum was identified, grasped with pick-ups and entered sharply with Metzenbaum scissors. The bladder flap was then created digitally and the bladder blade was reinserted. A low transverse uterine incision was made with the scalpel and extended laterally with blunt finger dissection. The baby???s head was then delivered atraumatically. The nose and mouth were suctioned. The cord was clamped and cut and the baby was handed off to the waiting neonatal care unit staff. Placenta was then delivered spontaneously. The uterus was exteriorized and cleared of all clots and debris. The uterine incision was closed in one layer with 0 Vicryl with good hemostasis assured. The pelvis was washed with warm normal saline. Good hemostasis was again reassured.   The paracolic gutters were washed with warm normal saline and the uterus was returned to the abdomen. Good hemostasis was again reassured throughout.  The peritoneum was closed with 2-0 chromic in a running fashion. The fascia was closed with 0 Vicryl in a running fashion. Good hemostasis was assured. The skin was closed with staples. The patient tolerated the procedure well. Sponge, lap, and needle counts were correct times two and the patient was taken to recovery in stable condition.      Estimated Blood Loss:  800 mls    Specimens:   ID Type Source Tests Collected by Time Destination   1 : placenta Placenta Placenta  Cranston Neighbor, MD 08/01/2016 1223 Discarded        Cord Blood Results:  Information for the patient's newborn:  Soriah, Leeman [[id number]]     Lab Results   Component Value Date/Time    DAT IgG NEG 08/01/2016 01:30 PM    ABO/Rh(D) A POSITIVE 08/01/2016 01:30 PM     Lab Results   Component Value Date/Time    PH,CORD BLD ARTERIAL 7.364 08/01/2016 01:30 PM    PCO2,CORD BLD ARTERIAL 46 08/01/2016 01:30 PM    PO2,CORD BLD ARTERIAL 19 08/01/2016 01:30 PM    HCO3,CORD BLD ARTERIAL 26 08/01/2016 01:30 PM    BASE DEFICIT,CBA 0.1 08/01/2016 01:30 PM    SITE CORD 08/01/2016 01:30 PM    SITE CORD 08/01/2016 01:30 PM    Respiratory comment: na at 9 7 2017 1 42 37 PM. Not read back. 08/01/2016 01:30 PM    Respiratory comment: na at 9 7  2017 1 43 06 PM. Not read back. 08/01/2016 01:30 PM        Prenatal Labs:  Lab Results   Component Value Date/Time    ABO/Rh(D) A NEGATIVE 08/01/2016 12:00 PM        Signed By:  Cranston Neighbor, MD     August 01, 2016

## 2016-08-01 NOTE — Progress Notes (Signed)
Pharmacy contacted, denise. May give ancef with suprax allergy- rash

## 2016-08-01 NOTE — H&P (Signed)
History & Physical    Name: Deborah Barker MRN: [id number]  SSN: [id number]    Date of Birth: February 01, 1991  Age: 25 y.o.  Sex: female      Subjective:     Estimated Date of Delivery: 08/06/16  OB History   Gravida Para Term Preterm AB Living   2 1 1   1    SAB TAB Ectopic Molar Multiple Live Births        1      # Outcome Date GA Lbr Len/2nd Weight Sex Delivery Anes PTL Lv   2 Current            1 Term 05/24/14    F  EPI N       Complications: Failure to Progress in Second Stage          Deborah Barker admitted with pregnancy at [redacted]w[redacted]d for cesarean section due to previous cesarean section. Prenatal course was normal. Please see prenatal records for details.    Past Medical History:   Diagnosis Date   ??? Adult hypothyroidism 09/11/2015   ??? Asthma 09/11/2015   ??? BMI 28.0-28.9,adult    ??? Chest pressure    ??? Cough    ??? Dizziness    ??? Fatigue 09/11/2015   ??? Head ache    ??? Left thumb sprain    ??? Sinusitis    ??? Thrombocytopenia (HCC) 09/11/2015     Past Surgical History:   Procedure Laterality Date   ??? HX CESAREAN SECTION  2015    X1      Social History     Occupational History   ??? Not on file.     Social History Main Topics   ??? Smoking status: Never Smoker   ??? Smokeless tobacco: Never Used   ??? Alcohol use No   ??? Drug use: No   ??? Sexual activity: Yes     Family History   Problem Relation Age of Onset   ??? Hypertension Mother    ??? Elevated Lipids Brother        Allergies   Allergen Reactions   ??? Nickel Rash   ??? Suprax [Cefixime] Rash     *CEPHALOSPORINS*  Per mother as a child     Prior to Admission medications    Medication Sig Start Date End Date Taking? Authorizing Provider   PNV No12-Iron-FA-DSS-OM-3 29 mg iron-1 mg -50 mg CPKD Take  by mouth.   Yes Historical Provider   levothyroxine (SYNTHROID) 75 mcg tablet Take 1 Tab by mouth Daily (before breakfast). Indications: hypothyroidism 06/04/16  Yes Macey Wurtz H Mykel Sponaugle, MD   ferrous sulfate 325 mg (65 mg iron) tablet  02/14/16  Yes Historical Provider         Review of Systems: A comprehensive review of systems was negative except for that written in the History of Present Illness.    Objective:     Vitals:  Vitals:    08/01/16 1139 08/01/16 1141   BP:  133/75   Pulse:  (!) 104   Resp:  18   Temp:  98.5 ??F (36.9 ??C)   Weight: 199 lb (90.3 kg)    Height: 5\' 2"  (1.575 m)         Physical Exam:  Patient without distress.  Membranes:  Intact  Fetal Heart Rate: Reactive    Prenatal Labs:   Lab Results   Component Value Date/Time    ABO/Rh(D) A NEGATIVE 08/01/2016 12:00 PM  Impression/Plan:     Plan:  Admit for cesarean section. Group B Strep was positive, use of prophylactic antibiotics not indicated. Discussed the risks of surgery including the risks of bleeding, infection, deep vein thrombosis, and surgical injuries to internal organs including but not limited to the bowels, bladder, rectum, and female reproductive organs. The patient understands the risks; any and all questions were answered to the patient's satisfaction.    Signed By:  Cranston NeighborMaduka H Shalayah Beagley, MD     August 01, 2016

## 2016-08-01 NOTE — Anesthesia Post-Procedure Evaluation (Signed)
Post-Anesthesia Evaluation and Assessment    Patient: Deborah Barker MRN: [id number]  SSN: [id number]    Date of Birth: [birth year]  Age: 25 y.o.  Sex: female       Cardiovascular Function/Vital Signs  Visit Vitals   ??? BP 101/56   ??? Pulse 65   ??? Temp 36.3 ??C (97.4 ??F)   ??? Resp 20   ??? Ht 5\' 2"  (1.575 m)   ??? Wt 90.3 kg (199 lb)   ??? SpO2 97%   ??? Breastfeeding No   ??? BMI 36.4 kg/m2       Patient is status post spinal anesthesia for Procedure(s):  CESAREAN SECTION.    Nausea/Vomiting: None    Postoperative hydration reviewed and adequate.    Pain:  Pain Scale 1: Numeric (0 - 10) (08/01/16 1415)  Pain Intensity 1: 0 (08/01/16 1415)   Managed    Neurological Status:   Neuro (WDL): Within Defined Limits (08/01/16 1415)   At baseline    Mental Status and Level of Consciousness: Arousable    Pulmonary Status:   O2 Device: Room air (08/01/16 1415)   Adequate oxygenation and airway patent    Complications related to anesthesia: None    Post-anesthesia assessment completed. No concerns    Signed By: Derry Lory., MD     August 01, 2016

## 2016-08-01 NOTE — Progress Notes (Signed)
SBAR OUT Report: Mother    Verbal report given to [facility] RN on this patient, who is now being transferred to [facility] for routine progression of care.   The patient is wearing a green "Anesthesia-Duramorph" band.    Report consisted of patient's Situation, Background, Assessment and Recommendations (SBAR).       Newborn ID bands were compared with the identification form, and verified with the patient and receiving nurse.    Information from the SBAR, Procedure Summary and Intake/Output and the [facility] Report was reviewed with the receiving nurse; opportunity for questions and clarification provided.

## 2016-08-01 NOTE — Anesthesia Pre-Procedure Evaluation (Addendum)
Anesthetic History   No history of anesthetic complications            Review of Systems / Medical History  Patient summary reviewed and pertinent labs reviewed    Pulmonary  Within defined limits                 Neuro/Psych   Within defined limits           Cardiovascular  Within defined limits                Exercise tolerance: >4 METS     GI/Hepatic/Renal     GERD: well controlled           Endo/Other      Hypothyroidism: well controlled       Other Findings            Physical Exam    Airway             Cardiovascular  Regular rate and rhythm,  S1 and S2 normal,  no murmur, click, rub, or gallop             Dental         Pulmonary  Breath sounds clear to auscultation               Abdominal  GI exam deferred       Other Findings            Anesthetic Plan    ASA: 2  Anesthesia type: spinal      Post-op pain plan if not by surgeon: intrathecal opiates      Anesthetic plan and risks discussed with: Patient and Family

## 2016-08-01 NOTE — Other (Signed)
SBAR IN Report: Mother    Verbal report received from Mont Dutton, RN (full name & credentials) on this patient, who is now being transferred from L&D (unit) for routine progression of care.  The patient is wearing a green "Anesthesia-Duramorph" band.    Report consisted of patient's Situation, Background, Assessment and Recommendations (SBAR).       Newborn ID bands were compared with the identification form, and verified with the patient and transferring nurse.    Information from the SBAR and Procedure Summary and the [facility] Report was reviewed with the transferring nurse; opportunity for questions and clarification provided.

## 2016-08-01 NOTE — Progress Notes (Signed)
FHT 125 before spinal. FHT 125 after spinal.

## 2016-08-01 NOTE — Progress Notes (Signed)
Here for 1300 scheduled repeat c-section

## 2016-08-01 NOTE — Op Note (Signed)
Deborah Barker     Date of Surgery: 08/01/2016     Preoperative Diagnosis: H/O Deborah Barker [Z98.891]  [redacted] weeks gestation of pregnancy [Z3A.39]    Postoperative Diagnosis: same with viable infant    Procedure: Procedure(s):  Deborah Barker    Surgeon(s) and Role:     * Deborah Coster H Dodd Schmid, MD - Primary     Anesthesia: Spinal    Procedure Detail:    The patient was taken to the operating room, where spinal anesthesia was found to be adequate.  The patient was prepped and draped in the normal sterile fashion. Pfannenstiel skin incision was made with the scalpel and carried down to the underlying fascia. The fascial incision was extended laterally with Mayo scissors. This fascial incision was grasped with Kocher clamps, tented up, and the underlying rectus muscles were dissected bluntly. The inferior edge of the rectus fascia was grasped with Kocher clamps, tented up, and the underlying rectus muscle was dissected off bluntly. The rectus muscle was divided in the midline bluntly. The peritoneum was entered bluntly with hemostat and extended inferiorly and superiorly with Metzenbaum scissors. The bladder blade was then inserted. The vesicouterine and peritoneum was identified, grasped with pick-ups and entered sharply with Metzenbaum scissors. The bladder flap was then created digitally and the bladder blade was reinserted. A low transverse uterine incision was made with the scalpel and extended laterally with blunt finger dissection. The baby???s head was then delivered atraumatically. The nose and mouth were suctioned. The cord was clamped and cut and the baby was handed off to the waiting neonatal care unit staff. Placenta was then delivered spontaneously. The uterus was exteriorized and cleared of all clots and debris. The uterine incision was closed in one layer with 0 Vicryl with good hemostasis assured. The pelvis was washed with warm normal saline. Good hemostasis was again reassured.   The paracolic gutters were washed with warm normal saline and the uterus was returned to the abdomen. Good hemostasis was again reassured throughout.  The peritoneum was closed with 2-0 chromic in a running fashion. The fascia was closed with 0 Vicryl in a running fashion. Good hemostasis was assured. The skin was closed with staples. The patient tolerated the procedure well. Sponge, lap, and needle counts were correct times two and the patient was taken to recovery in stable condition.      Estimated Blood Loss:  800 mls    Specimens:   ID Type Source Tests Collected by Time Destination   1 : placenta Placenta Placenta  Deborah Barker H Deborah Dino, MD 08/01/2016 1223 Discarded        Cord Blood Results:  Information for the patient's newborn:  Dry, Deborah Barker [[id number]]     Lab Results   Component Value Date/Time    DAT IgG NEG 08/01/2016 01:30 PM    ABO/Rh(D) A POSITIVE 08/01/2016 01:30 PM     Lab Results   Component Value Date/Time    PH,CORD BLD ARTERIAL 7.364 08/01/2016 01:30 PM    PCO2,CORD BLD ARTERIAL 46 08/01/2016 01:30 PM    PO2,CORD BLD ARTERIAL 19 08/01/2016 01:30 PM    HCO3,CORD BLD ARTERIAL 26 08/01/2016 01:30 PM    BASE DEFICIT,CBA 0.1 08/01/2016 01:30 PM    SITE CORD 08/01/2016 01:30 PM    SITE CORD 08/01/2016 01:30 PM    Respiratory comment: na at 9 7 2017 1 42 37 PM. Not read back. 08/01/2016 01:30 PM    Respiratory comment: na at 9 7   2017 1 43 06 PM. Not read back. 08/01/2016 01:30 PM        Prenatal Labs:  Lab Results   Component Value Date/Time    ABO/Rh(D) A NEGATIVE 08/01/2016 12:00 PM        Signed By:  Deborah Bobo H Rodger Giangregorio, MD     August 01, 2016

## 2016-08-01 NOTE — Anesthesia Procedure Notes (Addendum)
Spinal Block    Start time: 08/01/2016 1:12 PM  End time: 08/01/2016 1:13 PM  Performed by: HORTON JR, Dayona Shaheen H  Authorized by: HORTON JR, Emilija Bohman H     Pre-procedure:  Indications: at surgeon's request and primary anesthetic  Preanesthetic Checklist: patient identified, risks and benefits discussed, anesthesia consent, site marked, patient being monitored and timeout performed    Timeout Time: 13:11          Spinal Block:   Patient Position:  Seated  Prep Region:  Lumbar  Prep: DuraPrep      Location:  L2-3  Technique:  Single shot  Local:  Lidocaine 1%  Local Dose (mL):  3    Needle:   Needle Type:  Pencan  Needle Gauge:  25 G  Attempts:  1      Events: CSF confirmed, no blood with aspiration and no paresthesia        Assessment:  Insertion:  Uncomplicated  Patient tolerance:  Patient tolerated the procedure well with no immediate complications

## 2016-08-01 NOTE — Progress Notes (Signed)
Bedside Report of care using Wow received from Kerri Southerlin, RN. Pt stable.

## 2016-08-02 LAB — CBC WITH AUTOMATED DIFF
ABS. BASOPHILS: 0 10*3/uL (ref 0.0–0.2)
ABS. EOSINOPHILS: 0.1 10*3/uL (ref 0.0–0.8)
ABS. IMM. GRANS.: 0 10*3/uL (ref 0.0–0.5)
ABS. LYMPHOCYTES: 1.7 10*3/uL (ref 0.5–4.6)
ABS. MONOCYTES: 1.2 10*3/uL (ref 0.1–1.3)
ABS. NEUTROPHILS: 8.9 10*3/uL — ABNORMAL HIGH (ref 1.7–8.2)
BASOPHILS: 0 % (ref 0.0–2.0)
EOSINOPHILS: 1 % (ref 0.5–7.8)
HCT: 37.4 % (ref 35.8–46.3)
HGB: 12.5 g/dL (ref 11.7–15.4)
IMMATURE GRANULOCYTES: 0.3 % (ref 0.0–5.0)
LYMPHOCYTES: 14 % (ref 13–44)
MCH: 27.5 PG (ref 26.1–32.9)
MCHC: 33.4 g/dL (ref 31.4–35.0)
MCV: 82.2 FL (ref 79.6–97.8)
MONOCYTES: 10 % (ref 4.0–12.0)
MPV: 11.7 FL (ref 10.8–14.1)
NEUTROPHILS: 75 % (ref 43–78)
PLATELET: 134 10*3/uL — ABNORMAL LOW (ref 150–450)
RBC: 4.55 M/uL (ref 4.05–5.25)
RDW: 15.3 % — ABNORMAL HIGH (ref 11.9–14.6)
WBC: 11.9 10*3/uL — ABNORMAL HIGH (ref 4.3–11.1)

## 2016-08-02 LAB — FETAL SCREEN: Fetal screen: NEGATIVE

## 2016-08-02 MED ORDER — HYDROCODONE-ACETAMINOPHEN 7.5 MG-325 MG TAB
ORAL | Status: DC | PRN
Start: 2016-08-02 — End: 2016-08-04
  Administered 2016-08-03 – 2016-08-04 (×5): via ORAL

## 2016-08-02 MED ORDER — PROMETHAZINE 25 MG TAB
25 mg | Freq: Four times a day (QID) | ORAL | Status: DC | PRN
Start: 2016-08-02 — End: 2016-08-04

## 2016-08-02 MED ORDER — SIMETHICONE 80 MG CHEWABLE TAB
80 mg | Freq: Four times a day (QID) | ORAL | Status: DC
Start: 2016-08-02 — End: 2016-08-04
  Administered 2016-08-02 – 2016-08-04 (×6): via ORAL

## 2016-08-02 MED ORDER — DOCUSATE SODIUM 100 MG CAP
100 mg | Freq: Two times a day (BID) | ORAL | Status: DC
Start: 2016-08-02 — End: 2016-08-04
  Administered 2016-08-03 – 2016-08-04 (×2): via ORAL

## 2016-08-02 MED ORDER — DIPHENHYDRAMINE 25 MG CAP
25 mg | ORAL | Status: DC | PRN
Start: 2016-08-02 — End: 2016-08-04

## 2016-08-02 MED ORDER — IBUPROFEN 800 MG TAB
800 mg | Freq: Four times a day (QID) | ORAL | Status: DC | PRN
Start: 2016-08-02 — End: 2016-08-04
  Administered 2016-08-02 – 2016-08-04 (×6): via ORAL

## 2016-08-02 MED FILL — PROMETHAZINE 25 MG/ML INJECTION: 25 mg/mL | INTRAMUSCULAR | Qty: 1

## 2016-08-02 MED FILL — KETOROLAC TROMETHAMINE 30 MG/ML INJECTION: 30 mg/mL (1 mL) | INTRAMUSCULAR | Qty: 1

## 2016-08-02 MED FILL — SIMETHICONE 80 MG CHEWABLE TAB: 80 mg | ORAL | Qty: 1

## 2016-08-02 MED FILL — IBUPROFEN 800 MG TAB: 800 mg | ORAL | Qty: 1

## 2016-08-02 NOTE — Progress Notes (Signed)
Anesthesiology C-Section Post-op Note    Post-op day 1 s/p C-section via spinal with neuraxial opioids for post-op pain management.    Visit Vitals   ??? BP 90/55 (BP 1 Location: Left arm, BP Patient Position: At rest)   ??? Pulse 71   ??? Temp 36.8 ??C (98.2 ??F)   ??? Resp 17   ??? Ht 5\' 2"  (1.575 m)   ??? Wt 90.3 kg (199 lb)   ??? SpO2 97%   ??? Breastfeeding Unknown   ??? BMI 36.4 kg/m2     Airway patent, patient appropriately hydrated and appears euvolemic.  Patient is Alert and oriented.  Pain is well controlled.  Pruritus is absent.  Nausea is absent.  No complaints about back or site of injection.  Motor and sensory function has returned to baseline in lower extremities. Patient is satisfied with anesthetic and reports no complications.  Continue current orders, then initiate surgeon's orders for pain management 24 hours after C-section.  Follow up per surgeon.

## 2016-08-02 NOTE — Progress Notes (Signed)
Bedside report received from Danni O'Dell, RN. Care of patient assumed at this time.

## 2016-08-02 NOTE — Progress Notes (Signed)
AM assessment completed per protocol, plan of care discussed with patient. Patient denies any complaints. Instructed to call for any needs or questions. Voices understanding.

## 2016-08-02 NOTE — Progress Notes (Signed)
Foley catheter removed per protocol. Pt up OOB with assistance to bathroom. Peri care performed. Pt back in bed resting quietly. Instructed to call for needs or concerns.

## 2016-08-02 NOTE — Progress Notes (Signed)
Post-Operative Day Number 1 Progress Note    Patient doing well post-op day 1 from cesarean delivery without significant complaints.  Pain controlled on current medication.  Voiding without difficulty, normal lochia.    Vitals:  Patient Vitals for the past 8 hrs:   BP Temp Pulse Resp   08/02/16 1451 111/61 98.7 ??F (37.1 ??C) 80 18   08/02/16 1105 101/58 97.5 ??F (36.4 ??C) 84 16     Temp (24hrs), Avg:98 ??F (36.7 ??C), Min:97.4 ??F (36.3 ??C), Max:98.7 ??F (37.1 ??C)      Vital signs stable, afebrile.    Exam:  Patient without distress.               Abdomen soft, fundus firm at below level of umbilicus, non tender.                 Incision dry and clean without erythema.               Lower extremities are negative for swelling, cords or tenderness.    Lab/Data Review:  CBC:   Lab Results   Component Value Date/Time    WBC 11.9 (H) 08/02/2016 06:43 AM    HGB 12.5 08/02/2016 06:43 AM    HCT 37.4 08/02/2016 06:43 AM    PLT 134 (L) 08/02/2016 06:43 AM       Assessment and Plan:  Patient appears to be having uncomplicated post-cesarean course.  Continue routine post-op care and maternal education.

## 2016-08-02 NOTE — Progress Notes (Signed)
Motrin 800 mg given po for complaints of incisional discomfort and cramping.

## 2016-08-03 MED ORDER — RHO D IMMUNE GLOBULIN 300 MCG IM SYRG
1500 unit (300 mcg) | Freq: Once | INTRAMUSCULAR | Status: AC
Start: 2016-08-03 — End: 2016-08-03
  Administered 2016-08-03: 22:00:00 via INTRAMUSCULAR

## 2016-08-03 MED FILL — DOCUSATE SODIUM 100 MG CAP: 100 mg | ORAL | Qty: 1

## 2016-08-03 MED FILL — HYDROCODONE-ACETAMINOPHEN 7.5 MG-325 MG TAB: ORAL | Qty: 1

## 2016-08-03 MED FILL — SIMETHICONE 80 MG CHEWABLE TAB: 80 mg | ORAL | Qty: 1

## 2016-08-03 MED FILL — IBUPROFEN 800 MG TAB: 800 mg | ORAL | Qty: 1

## 2016-08-03 MED FILL — HYPERRHO S/D 1,500 UNIT (300 MCG) INTRAMUSCULAR SYRINGE: 1500 unit (300 mcg) | INTRAMUSCULAR | Qty: 1

## 2016-08-03 NOTE — Progress Notes (Signed)
Getting in shower. No needs voiced at this time.

## 2016-08-03 NOTE — Progress Notes (Signed)
Post-Operative Day Number 2 Progress Note    Patient doing well post-op day 2 from cesarean delivery without significant complaints.  Pain controlled on current medication.  Voiding without difficulty, normal lochia.    Vitals:  Patient Vitals for the past 8 hrs:   BP Temp Pulse Resp   08/03/16 0715 107/57 98.2 ??F (36.8 ??C) 79 12     Temp (24hrs), Avg:98.6 ??F (37 ??C), Min:98.2 ??F (36.8 ??C), Max:99 ??F (37.2 ??C)      Vital signs stable, afebrile.    Exam:  Patient without distress.               Abdomen soft, fundus firm at below level of umbilicus, non tender.                Incision dry and clean without erythema.               Lower extremities are negative for swelling, cords or tenderness.        Assessment and Plan:  Patient appears to be having uncomplicated post-cesarean course.  Continue routine post-op care and maternal education.

## 2016-08-03 NOTE — Progress Notes (Signed)
Norco and Motrin given PO per request for pain.

## 2016-08-03 NOTE — Progress Notes (Signed)
AM assessment completed per protocol, plan of care discussed with patient. Patient denies any complaints. Instructed to call for any needs or questions. Voices understanding.

## 2016-08-03 NOTE — Progress Notes (Signed)
Motrin 800 mg and norco 7.5 mg given po for complaints of incisonal pain. Rhogam given IM in left ventral gluteus.

## 2016-08-03 NOTE — Progress Notes (Signed)
Assessment completed.

## 2016-08-03 NOTE — Progress Notes (Signed)
Norco, Motrin, Mylicon given PO Per request for pain

## 2016-08-03 NOTE — Progress Notes (Signed)
Report received from Cindy Waldrop, RN. Patient care assumed-bedside reporting completed.

## 2016-08-03 NOTE — Progress Notes (Signed)
Bedside report received from Tiffany Beavers, RN. Care of patient assumed at this time.

## 2016-08-04 MED ORDER — IBUPROFEN 800 MG TAB
800 mg | ORAL_TABLET | Freq: Four times a day (QID) | ORAL | 1 refills | Status: DC | PRN
Start: 2016-08-04 — End: 2016-09-25

## 2016-08-04 MED ORDER — HYDROCODONE-ACETAMINOPHEN 7.5 MG-325 MG TAB
ORAL_TABLET | ORAL | 0 refills | Status: DC | PRN
Start: 2016-08-04 — End: 2016-09-25

## 2016-08-04 MED FILL — SIMETHICONE 80 MG CHEWABLE TAB: 80 mg | ORAL | Qty: 1

## 2016-08-04 MED FILL — IBUPROFEN 800 MG TAB: 800 mg | ORAL | Qty: 1

## 2016-08-04 MED FILL — DOCUSATE SODIUM 100 MG CAP: 100 mg | ORAL | Qty: 1

## 2016-08-04 MED FILL — HYDROCODONE-ACETAMINOPHEN 7.5 MG-325 MG TAB: ORAL | Qty: 1

## 2016-08-04 NOTE — Discharge Summary (Signed)
Obstetrical Discharge Summary     Name: Deborah Barker MRN: [id number]  SSN: [id number]    Date of Birth: 1991-01-19  Age: 25 y.o.  Sex: female      Admit Date: 08/01/2016    Discharge Date: 08/19/2016     Admitting Physician: Cranston NeighborMaduka H Slaton Reaser, MD     Attending Physician:  No att. providers found     * Admission Diagnoses: H/O cesarean section [Z98.891];[redacted] weeks gestation of pregna*    * Discharge Diagnoses:   Information for the patient's newborn:  Tomasa BlaseFranklin, Skyler [[id number]][[id number]]   Delivery of a 7 lb 7.4 oz (3.385 kg) female infant via C-Section, Low Transverse on 08/01/2016 at 1:30 PM  by . Apgars were 8 and 9.       Additional Diagnoses:   Hospital Problems as of 08/04/2016  Date Reviewed: 07/23/2016          Codes Class Noted - Resolved POA    H/O cesarean section ICD-10-CM: Z98.891  ICD-9-CM: V45.89  08/01/2016 - Present Unknown             Lab Results   Component Value Date/Time    ABO/Rh(D) A NEGATIVE 08/01/2016 12:00 PM      Immunization History   Administered Date(s) Administered   ??? Rho(D) Immune Globulin - IM 05/21/2016, 08/03/2016       * Procedures:   Procedure(s) with comments:  CESAREAN SECTION - edd 08/06/2016 Repeat Cesarean Section           * Discharge Condition: stable    * Hospital Course: Normal hospital course following the delivery.    * Disposition: Home    Discharge Medications:   Discharge Medication List as of 08/04/2016 12:00 PM      START taking these medications    Details   HYDROcodone-acetaminophen (NORCO) 7.5-325 mg per tablet Take 1 Tab by mouth every four (4) hours as needed. Max Daily Amount: 6 Tabs., Print, Disp-40 Tab, R-0      ibuprofen (MOTRIN) 800 mg tablet Take 1 Tab by mouth every six (6) hours as needed., Normal, Disp-30 Tab, R-1         CONTINUE these medications which have NOT CHANGED    Details   PNV No12-Iron-FA-DSS-OM-3 29 mg iron-1 mg -50 mg CPKD Take  by mouth., Historical Med      levothyroxine (SYNTHROID) 75 mcg tablet Take 1 Tab by mouth Daily (before breakfast). Indications:  hypothyroidism, Normal, Disp-30 Tab, R-2      ferrous sulfate 325 mg (65 mg iron) tablet Historical Med             * Follow-up Care/Patient Instructions:  Activity: No sex for 6 weeks  Diet: Regular Diet  Wound Care: As directed    Follow-up Information     Follow up With Details Comments Contact Info    None   None (395) Patient stated that they have no PCP      Cranston NeighborMaduka H Hayze Gazda, MD   [address]  [facility] [place]  [place] [id number]  [phone]      Cranston NeighborMaduka H Fryda Molenda, MD  to call for follow up in 2 weeks [address]  [facility] [place]   [place] [phone]  [phone]             Signed By:  Cranston NeighborMaduka H Jaianna Nicoll, MD     August 19, 2016

## 2016-08-04 NOTE — Progress Notes (Signed)
AM assessment completed per protocol, plan of care discussed with patient. Patient denies any complaints. Instructed to call for any needs or questions. Voices understanding.

## 2016-08-04 NOTE — Progress Notes (Signed)
Pt staples removed. Several staples in crooked and embedded. MD assisted with final staple. Some scant bleeding. Steri-strips applied and covered with peri-pad. Patient given pain medication during procedure due to difficulty with embedded staples.

## 2016-08-04 NOTE — Progress Notes (Signed)
Pt discharged home with infant after ID bands verified and code alert removed. Discharge teaching complete. Pt verbalized understanding; questions encouraged.  Pt transported via wheelchair to private vehicle per pt request. Infant transferred in rear facing car seat carried by father. Pt stable at discharge.

## 2016-08-04 NOTE — Progress Notes (Signed)
Bedside report received from Tiffany Beavers, RN. Care of patient assumed at this time.

## 2016-08-04 NOTE — Progress Notes (Signed)
Pt staple removal difficult. Pt given Norco 7.5/325 mg PO and Motrin 800 mg PO.

## 2016-08-04 NOTE — Discharge Summary (Signed)
Obstetrical Discharge Summary     Name: Deborah Barker MRN: [id number]  SSN: [id number]    Date of Birth: 04-04-1991  Age: 25 y.o.  Sex: female      Admit Date: 08/01/2016    Discharge Date: 08/19/2016     Admitting Physician: Cranston Neighbor, MD     Attending Physician:  No att. providers found     * Admission Diagnoses: H/O cesarean section [Z98.891];[redacted] weeks gestation of pregna*    * Discharge Diagnoses:   Information for the patient's newborn:  Tomasa Blase [[id number]]   Delivery of a 7 lb 7.4 oz (3.385 kg) female infant via C-Section, Low Transverse on 08/01/2016 at 1:30 PM  by . Apgars were 8 and 9.       Additional Diagnoses:   Hospital Problems as of 08/04/2016  Date Reviewed: 24-Jul-2016          Codes Class Noted - Resolved POA    H/O cesarean section ICD-10-CM: Z98.891  ICD-9-CM: V45.89  08/01/2016 - Present Unknown             Lab Results   Component Value Date/Time    ABO/Rh(D) A NEGATIVE 08/01/2016 12:00 PM      Immunization History   Administered Date(s) Administered   ??? Rho(D) Immune Globulin - IM 05/21/2016, 08/03/2016       * Procedures:   Procedure(s) with comments:  CESAREAN SECTION - edd 08/06/2016 Repeat Cesarean Section           * Discharge Condition: stable    * Hospital Course: Normal hospital course following the delivery.    * Disposition: Home    Discharge Medications:   Discharge Medication List as of 08/04/2016 12:00 PM      START taking these medications    Details   HYDROcodone-acetaminophen (NORCO) 7.5-325 mg per tablet Take 1 Tab by mouth every four (4) hours as needed. Max Daily Amount: 6 Tabs., Print, Disp-40 Tab, R-0      ibuprofen (MOTRIN) 800 mg tablet Take 1 Tab by mouth every six (6) hours as needed., Normal, Disp-30 Tab, R-1         CONTINUE these medications which have NOT CHANGED    Details   PNV No12-Iron-FA-DSS-OM-3 29 mg iron-1 mg -50 mg CPKD Take  by mouth., Historical Med      levothyroxine (SYNTHROID) 75 mcg tablet Take 1 Tab by mouth Daily (before  breakfast). Indications: hypothyroidism, Normal, Disp-30 Tab, R-2      ferrous sulfate 325 mg (65 mg iron) tablet Historical Med             * Follow-up Care/Patient Instructions:  Activity: No sex for 6 weeks  Diet: Regular Diet  Wound Care: As directed    Follow-up Information     Follow up With Details Comments Contact Info    None   None (395) Patient stated that they have no PCP      Cranston Neighbor, MD   [address]  [facility] [place] [address]  [phone]      Cranston Neighbor, MD  to call for follow up in 2 weeks [address]  [facility] [place] [address]  [phone]             Signed By:  Cranston Neighbor, MD     August 19, 2016

## 2016-08-15 ENCOUNTER — Ambulatory Visit: Admit: 2016-08-15 | Discharge: 2016-08-15 | Payer: MEDICAID | Attending: Obstetrics & Gynecology

## 2016-08-15 DIAGNOSIS — Z3009 Encounter for other general counseling and advice on contraception: Secondary | ICD-10-CM

## 2016-08-15 NOTE — Progress Notes (Signed)
Subjective:   Deborah Barker is a 25 y.o. female who presents for contraception counseling. The patient had repeat C/s 2 weeks ago.   The patient is not currently sexually active.  Social History: single partner, contraception - none.  Pertinent past medical hstory: no history of HTN, DVT, CAD, DM, liver disease, migraines or smoking.    Patient Active Problem List   Diagnosis Code   ??? Asthma J45.909   ??? Adult hypothyroidism E03.9   ??? Thrombocytopenia (HCC) D69.6   ??? H/O cesarean section Z98.891     Patient Active Problem List    Diagnosis Date Noted   ??? H/O cesarean section 08/01/2016   ??? Asthma 09/11/2015   ??? Adult hypothyroidism 09/11/2015   ??? Thrombocytopenia (HCC) 09/11/2015     Current Outpatient Prescriptions   Medication Sig Dispense Refill   ??? HYDROcodone-acetaminophen (NORCO) 7.5-325 mg per tablet Take 1 Tab by mouth every four (4) hours as needed. Max Daily Amount: 6 Tabs. 40 Tab 0   ??? ibuprofen (MOTRIN) 800 mg tablet Take 1 Tab by mouth every six (6) hours as needed. 30 Tab 1   ??? PNV No12-Iron-FA-DSS-OM-3 29 mg iron-1 mg -50 mg CPKD Take  by mouth.     ??? levothyroxine (SYNTHROID) 75 mcg tablet Take 1 Tab by mouth Daily (before breakfast). Indications: hypothyroidism 30 Tab 2   ??? ferrous sulfate 325 mg (65 mg iron) tablet        Allergies   Allergen Reactions   ??? Nickel Rash   ??? Suprax [Cefixime] Rash     *CEPHALOSPORINS*  Per mother as a child     Past Medical History:   Diagnosis Date   ??? Adult hypothyroidism 09/11/2015   ??? Asthma 09/11/2015   ??? BMI 28.0-28.9,adult    ??? Chest pressure    ??? Cough    ??? Dizziness    ??? Fatigue 09/11/2015   ??? Head ache    ??? Left thumb sprain    ??? Sinusitis    ??? Thrombocytopenia (HCC) 09/11/2015     Past Surgical History:   Procedure Laterality Date   ??? HX CESAREAN SECTION  2015    X1      Family History   Problem Relation Age of Onset   ??? Hypertension Mother    ??? Elevated Lipids Brother      Social History   Substance Use Topics   ??? Smoking status: Never Smoker    ??? Smokeless tobacco: Never Used   ??? Alcohol use No        GYN Review: normal menses, no abnormal bleeding, pelvic pain or discharge, no breast pain or new or enlarging lumps on self exam    Additional Review of Systems  Pertinent items are noted in HPI.     Objective:     Visit Vitals   ??? BP 112/80   ??? Pulse 93   ??? Temp 97.4 ??F (36.3 ??C)   ??? Resp 18   ??? Wt 178 lb (80.7 kg)   ??? SpO2 99%   ??? BMI 32.56 kg/m2     The patient appears well, alert, oriented x 3, in no distress.  ENT normal.  Neck supple. No adenopathy or thyromegaly. PERLA. Lungs are clear, good air entry, no wheezes, rhonchi or rales. S1 and S2 normal, no murmurs, regular rate and rhythm. Abdomen soft without tenderness, guarding, mass or organomegaly. Extremities show no edema, normal peripheral pulses. Neurological is normal, no focal findings.  BREAST EXAM: breasts appear normal, no suspicious masses, no skin or nipple changes or axillary nodes    PELVIC EXAM: examination not indicated    Assessment/Plan:     25 y.o. year old, starting OCP (Oral Contraceptive Pills) and OCP (estrogen/progesterone), no contraindications.  counseled on breast self exam, STD prevention, HIV risk factors and prevention and family planning choices  return annually or prn    ICD-10-CM ICD-9-CM    1. General counseling and advice on contraceptive management Z30.09 V25.09      Follow up in 4 weeks for postpartum visit.  More than 50% of this 15 minute visit was spent on counseling.

## 2016-09-12 ENCOUNTER — Encounter: Payer: MEDICAID | Attending: Obstetrics & Gynecology

## 2016-09-12 ENCOUNTER — Ambulatory Visit: Admit: 2016-09-12 | Discharge: 2016-09-12 | Payer: MEDICAID | Attending: Obstetrics & Gynecology

## 2016-09-12 MED ORDER — DESOGESTREL-ETHINYL ESTRADIOL 0.15 MG-30 MCG TAB
Freq: Every day | ORAL | 12 refills | Status: DC
Start: 2016-09-12 — End: 2017-01-22

## 2016-09-12 NOTE — Progress Notes (Signed)
Post-Partum Visit    Deborah Barker is a 25 y.o. female who presents today for a post-partum visit status post cesarean delivery.    Delivery Complications: None       Vaginal Bleeding: None  Patient reports normal lochia.     Feeding:  bottle    Post Partum Complications:  None   Denies PP Depression    Contraceptive options discussed with pt.    O:   Vitals:    09/12/16 1427   BP: 120/90   Pulse: 85   Resp: 18   Temp: 97.4 ??F (36.3 ??C)   SpO2: 98%   Weight: 175 lb (79.4 kg)      GENERAL APPEARANCE: alert, well appearing, in no apparent distress   Pt in no acute distress.    She is awake, alert and well oriented   Abdomen:  soft, non-distended, non-tender       Uterus is involuting appropriately.    A/P:     ICD-10-CM ICD-9-CM    1. Postpartum examination following cesarean delivery Z39.2 V24.2    2. General counseling and advice for contraceptive management Z30.09 V25.09 desogestrel-ethinyl estradiol (DESOGEN) 0.15-0.03 mg tab   .     Follow up in 1 years   Rx. Oral contraceptive pills.              Discussed all birth control options in the usual detailed fashion. She opts for oral contraceptive pills. Possible side effects discussed in details.

## 2016-09-25 ENCOUNTER — Ambulatory Visit: Admit: 2016-09-25 | Discharge: 2016-09-25 | Payer: MEDICAID | Attending: Family

## 2016-09-25 ENCOUNTER — Ambulatory Visit: Attending: Family

## 2016-09-25 DIAGNOSIS — R1013 Epigastric pain: Secondary | ICD-10-CM

## 2016-09-25 MED ORDER — OMEPRAZOLE 40 MG CAP, DELAYED RELEASE
40 mg | ORAL_CAPSULE | Freq: Every day | ORAL | 1 refills | Status: DC
Start: 2016-09-25 — End: 2017-01-22

## 2016-09-25 NOTE — Progress Notes (Signed)
[facility]  [address]  [place], [place] [id number]  Office : [phone]  Fax :  [phone]     09/30/2016    Deborah Barker is a 25 y.o. female who is seen for evaluation of   Chief Complaint   Patient presents with   ??? Abdominal Pain     Pt. c/o having upper abdominal pain. Pt. c/o feeling nauseated, and having the pain just laying, and sitting. Pt. also c/o having back pain as well. Pt. states that she realizes certain things she eats or drinks triggers it.  Pt. states after she eats she notices the pain is worst.        Subjective:  The patient is a 25 y.o. female  who presents for intermittent episodes of epigastric pain noticed to be increasing since birth of her last child approximately 1 month ago by cesarean section, states history of "indigestion" as a child, took medication to treat that with good success, states had indigestion/acid reflux during pregnancy, declined to treat with medication to avoid interactions with thyroid medication pregnancy, states has been approximately 5 episodes of intense pain up to 10 out of 10 at its worst over the past month with worst this a.m.  States it does appear to be related to what she eats, does occur in the morning, often leads to nausea.    Review of Systems:  Review of Systems   Constitutional: Negative for fever and malaise/fatigue.   HENT: Negative for congestion, ear pain and sore throat.    Eyes: Negative for pain and redness.   Respiratory: Negative for cough and shortness of breath.    Cardiovascular: Negative for chest pain, palpitations and leg swelling.   Gastrointestinal: Positive for heartburn. Negative for abdominal pain and nausea.        Epigastric pain typically does not radiate into the chest, did radiate substernal chest pain, resolved at present, worse after eating at times.  Occurred approximately 5 times in the past month.  States the pain is intense sharp 10 out of 10    Musculoskeletal: Negative for back pain and neck pain.   Neurological: Negative for dizziness and focal weakness.   Psychiatric/Behavioral: Negative for depression and suicidal ideas.       History:  Past Medical History:   Diagnosis Date   ??? Adult hypothyroidism 09/11/2015   ??? Asthma 09/11/2015   ??? BMI 28.0-28.9,adult    ??? Chest pressure    ??? Cough    ??? Dizziness    ??? Fatigue 09/11/2015   ??? Head ache    ??? Left thumb sprain    ??? Sinusitis    ??? Thrombocytopenia (HCC) 09/11/2015       Past Surgical History:   Procedure Laterality Date   ??? HX CESAREAN SECTION  2015    X1        Family History   Problem Relation Age of Onset   ??? Hypertension Mother    ??? Elevated Lipids Brother        Social History   Substance Use Topics   ??? Smoking status: Never Smoker   ??? Smokeless tobacco: Never Used   ??? Alcohol use No       Allergies:  Allergies   Allergen Reactions   ??? Nickel Rash   ??? Suprax [Cefixime] Rash     *CEPHALOSPORINS*  Per mother as a child       Vitals:  Visit Vitals   ???  BP 124/80 (BP 1 Location: Left arm, BP Patient Position: Sitting)   ??? Pulse 83   ??? Temp 97.7 ??F (36.5 ??C) (Tympanic)   ??? Resp 18   ??? Ht 5\' 2"  (1.575 m)   ??? Wt 178 lb (80.7 kg)   ??? LMP 09/09/2016   ??? SpO2 98%   ??? BMI 32.56 kg/m2       Physical Exam:  Physical Exam   Constitutional: She is oriented to person, place, and time. She appears well-developed and well-nourished.   HENT:   Head: Normocephalic.   Nose: No rhinorrhea.   Eyes: Conjunctivae are normal. Pupils are equal, round, and reactive to light. Right eye exhibits no exudate. Left eye exhibits no exudate.   Neck: Normal range of motion.   Cardiovascular: Normal rate and regular rhythm.    No murmur heard.  EKG sinus rhythm beats per minute no ectopy noted read by provider   Pulmonary/Chest: Effort normal and breath sounds normal. She has no wheezes.   Abdominal: Soft. Bowel sounds are normal. She exhibits no distension. There is no tenderness. There is no rebound.    Musculoskeletal: Normal range of motion.   Neurological: She is alert and oriented to person, place, and time.   Skin: Skin is warm and dry.   Psychiatric: She has a normal mood and affect.   Nursing note and vitals reviewed.      Assessment and Plan:     Diagnoses and all orders for this visit:    1. Epigastric pain  -     H. PYLORI BREATH TEST; Future  -     AMB POC EKG ROUTINE W/ 12 LEADS, INTER & REP    2. Gastroesophageal reflux disease without esophagitis  -     omeprazole (PRILOSEC) 40 mg capsule; Take 1 Cap by mouth daily.      EKG sinus rhythm 60 bpm no ectopy read by provider, symptoms consistent with acid reflux, H. pylori to evaluate, PPI to treat patient to return to clinic in 2 weeks for follow-up  Follow-up Disposition: Not on File    All ancillary documentation entered reviewed by provider.       Mirian Capuchinerry L Pau Banh, NP

## 2016-09-25 NOTE — Patient Instructions (Addendum)
Indigestion (Dyspepsia or Heartburn): Care Instructions  Your Care Instructions  Sometimes it can be hard to pinpoint the cause of indigestion. (It is also called dyspepsia or heartburn.) Most cases of an upset stomach with bloating, burning, burping, and nausea are minor and go away within several hours. Home treatment and over-the-counter medicine often are able to control symptoms. But if you take medicine to relieve your indigestion without making diet and lifestyle changes, your symptoms are likely to return again and again.  If you get indigestion often, it may be a sign of a more serious medical problem. Be sure to follow up with your doctor, who may want to do tests to be sure of the cause of your indigestion.  Follow-up care is a key part of your treatment and safety. Be sure to make and go to all appointments, and call your doctor if you are having problems. It's also a good idea to know your test results and keep a list of the medicines you take.  How can you care for yourself at home?  ?? Your doctor may recommend over-the-counter medicine. For mild or occasional indigestion, antacids such as Gaviscon, Mylanta, Maalox, or Tums, may help. Be safe with medicines. Be careful when you take over-the-counter antacid medicines. Many of these medicines have aspirin in them. Read the label to make sure that you are not taking more than the recommended dose. Too much aspirin can be harmful.  ?? Your doctor also may recommend over-the-counter acid reducers, such as Pepcid AC, Tagamet HB, Zantac 75, or Prilosec. Read and follow all instructions on the label. If you use these medicines often, talk with your doctor.  ?? Change your eating habits.  ?? It's best to eat several small meals instead of two or three large meals.  ?? After you eat, wait 2 to 3 hours before you lie down.  ?? Chocolate, mint, and alcohol can make GERD worse.  ?? Spicy foods, foods that have a lot of acid (like tomatoes and oranges),  and coffee can make GERD symptoms worse in some people. If your symptoms are worse after you eat a certain food, you may want to stop eating that food to see if your symptoms get better.  ?? Do not smoke or chew tobacco. Smoking can make GERD worse. If you need help quitting, talk to your doctor about stop-smoking programs and medicines. These can increase your chances of quitting for good.  ?? If you have GERD symptoms at night, raise the head of your bed 6 to 8 inches. You can do this by putting the frame on blocks or placing a foam wedge under the head of your mattress. (Adding extra pillows does not work.)  ?? Do not wear tight clothing around your middle.  ?? Lose weight if you need to. Losing just 5 to 10 pounds can help.  ?? Do not take anti-inflammatory medicines, such as aspirin, ibuprofen (Advil, Motrin), or naproxen (Aleve). These can irritate the stomach. If you need a pain medicine, try acetaminophen (Tylenol), which does not cause stomach upset.  When should you call for help?  Call your doctor now or seek immediate medical care if:  ? ?? You have new or worse belly pain.   ? ?? You are vomiting.   ?Watch closely for changes in your health, and be sure to contact your doctor if:  ? ?? You have new or worse symptoms of indigestion.   ? ?? You have trouble or pain swallowing.   ? ??   You are losing weight.   ? ?? You do not get better as expected.   Where can you learn more?  Go to http://www.healthwise.net/GoodHelpConnections.  Enter W912 in the search box to learn more about "Indigestion (Dyspepsia or Heartburn): Care Instructions."  Current as of: Apr 05, 2016  Content Version: 11.4  ?? 2006-2017 [employer]. Care instructions adapted under license by [employer] (which disclaims liability or warranty for this information). If you have questions about a medical condition or this instruction, always ask your healthcare professional. Healthwise,  Incorporated disclaims any warranty or liability for your use of this information.       Gastroesophageal Reflux Disease (GERD): Care Instructions  Your Care Instructions    Gastroesophageal reflux disease (GERD) is the backward flow of stomach acid into the esophagus. The esophagus is the tube that leads from your throat to your stomach. A one-way valve prevents the stomach acid from moving up into this tube. When you have GERD, this valve does not close tightly enough.  If you have mild GERD symptoms including heartburn, you may be able to control the problem with antacids or over-the-counter medicine. Changing your diet, losing weight, and making other lifestyle changes can also help reduce symptoms.  Follow-up care is a key part of your treatment and safety. Be sure to make and go to all appointments, and call your doctor if you are having problems. It's also a good idea to know your test results and keep a list of the medicines you take.  How can you care for yourself at home?  ?? Take your medicines exactly as prescribed. Call your doctor if you think you are having a problem with your medicine.  ?? Your doctor may recommend over-the-counter medicine. For mild or occasional indigestion, antacids, such as Tums, Gaviscon, Mylanta, or Maalox, may help. Your doctor also may recommend over-the-counter acid reducers, such as Pepcid AC, Tagamet HB, Zantac 75, or Prilosec. Read and follow all instructions on the label. If you use these medicines often, talk with your doctor.  ?? Change your eating habits.  ?? It's best to eat several small meals instead of two or three large meals.  ?? After you eat, wait 2 to 3 hours before you lie down.  ?? Chocolate, mint, and alcohol can make GERD worse.  ?? Spicy foods, foods that have a lot of acid (like tomatoes and oranges), and coffee can make GERD symptoms worse in some people. If your symptoms are worse after you eat a certain food, you may want to stop eating that  food to see if your symptoms get better.  ?? Do not smoke or chew tobacco. Smoking can make GERD worse. If you need help quitting, talk to your doctor about stop-smoking programs and medicines. These can increase your chances of quitting for good.  ?? If you have GERD symptoms at night, raise the head of your bed 6 to 8 inches by putting the frame on blocks or placing a foam wedge under the head of your mattress. (Adding extra pillows does not work.)  ?? Do not wear tight clothing around your middle.  ?? Lose weight if you need to. Losing just 5 to 10 pounds can help.  When should you call for help?  Call your doctor now or seek immediate medical care if:  ? ?? You have new or different belly pain.   ? ?? Your stools are black and tarlike or have streaks of blood.   ?Watch   closely for changes in your health, and be sure to contact your doctor if:  ? ?? Your symptoms have not improved after 2 days.   ? ?? Food seems to catch in your throat or chest.   Where can you learn more?  Go to [employer].  Enter [phone] in the search box to learn more about "Gastroesophageal Reflux Disease (GERD): Care Instructions."  Current as of: Apr 05, 2016  Content Version: 11.4  ?? 2006-2017 [employer]. Care instructions adapted under license by [employer] (which disclaims liability or warranty for this information). If you have questions about a medical condition or this instruction, always ask your healthcare professional. Healthwise, Incorporated disclaims any warranty or liability for your use of this information.  Omeprazole (By mouth)   Omeprazole (oh-MEP-ra-zole)  Treats heartburn, a damaged esophagus, stomach ulcers, and gastroesophageal reflux disease (GERD). This medicine is a proton pump inhibitor (PPI).   Brand Name(s): First-Omeprazole, [facility] Omeprazole, Good Sense Omeprazole, Leader Omeprazole, Omeclamox-Pak, PrevidolRx Analgesic  Pak, PriLOSEC, PriLOSEC OTC, [facility] Omeprazole Magnesium, [facility] Omeprazole, [employer] Omeprazole, TopCare Omeprazole   There may be other brand names for this medicine.  When This Medicine Should Not Be Used:   This medicine is not right for everyone. Do not use it if you had an allergic reaction to omeprazole or similar medicines.  How to Use This Medicine:   Delayed Release Capsule, Packet, Powder for Suspension, Delayed Release Tablet  ?? Your doctor will tell you how much medicine to use. Do not use more than directed.  ?? This medicine should come with a Medication Guide. Ask your pharmacist for a copy if you do not have one.  ?? Follow the instructions on the medicine label if you are using this medicine without a prescription. If you are using the over-the-counter medicine, do not take it for more than 14 days or use the treatment more often than every 4 months unless your doctor tells you to.  ?? Take this medicine at least 1 hour before a meal and for the full time of treatment, even if you begin to feel better after a few days.  ?? Capsule or tablet:   ?? Swallow whole. Do not crush or chew it.  ?? If you cannot swallow the capsule, you may open it and pour the medicine into a small amount of applesauce. Stir this mixture well and swallow it without chewing. To help make sure you get the full dose, drink a full glass of water.  ?? Oral packet:   ?? Mix the contents of a 2.5-milligram (mg) packet with 5 milliliters (mL) of water or mix the contents of a 10-mg packet with 15 mL of water. Do not use other liquids or food.  ?? Stir well. Let the mixture thicken for 2 to 3 minutes.  ?? Stir again and drink the medicine within 30 minutes.  ?? If there is any medicine left, add more water, stir, and drink immediately.  ?? To prepare the oral packet for a feeding tube:   ?? Add 5 mL of water to a catheter-tipped syringe. Then add the contents of a 2.5-mg packet (or use 15 mL of water for the 10-mg packet).   ?? Shake the syringe right away. Let the mixture thicken for 2 to 3 minutes.  ?? Shake the syringe once more. Give the medicine through the tube within 30 minutes.  ?? Refill the syringe with an equal amount of water and shake  it. Use the water to flush the tube to make sure all the medicine is given.  ?? Missed dose: Take a dose as soon as you remember. If it is almost time for your next dose, wait until then and take a regular dose. Do not take extra medicine to make up for a missed dose.  ?? Store the medicine in a closed container at room temperature, away from heat, moisture, and direct light.  Drugs and Foods to Avoid:   Ask your doctor or pharmacist before using any other medicine, including over-the-counter medicines, vitamins, and herbal products.  ?? Do not use omeprazole if you are also using medicines that contain rilpivirine.  ?? Some foods and medicines can affect how omeprazole works. Tell your doctor if you are using any of the following:  ?? Amoxicillin, atazanavir, cilostazol, citalopram, clarithromycin, clopidogrel, cyclosporine, dasatinib, digoxin, disulfiram, erlotinib, itraconazole, ketoconazole, methotrexate, mycophenolate mofetil, nelfinavir, nilotinib, phenytoin, rifampin, saquinavir, St John's wort, tacrolimus, voriconazole  ?? Benzodiazepine medicine (including diazepam)  ?? Blood thinner (including warfarin)  ?? Diuretic (water pill)  ?? Iron supplements  Warnings While Using This Medicine:   ?? Tell your doctor if you are pregnant or breastfeeding, or if you have liver disease, lupus, or osteoporosis.  ?? This medicine may cause the following problems:   ?? Kidney problems  ?? Low vitamin B12 or magnesium levels in the blood  ?? Increased risk of broken bones in the hip, wrist, or spine  ?? This medicine can cause diarrhea. Call your doctor if the diarrhea becomes severe, does not stop, or is bloody. Do not take any medicine to stop diarrhea until you have talked to your doctor. Diarrhea can occur 2  months or more after you stop taking this medicine.  ?? Tell any doctor or dentist who treats you that you are using this medicine. This medicine may affect certain medical test results.  ?? Your doctor will check your progress and the effects of this medicine at regular visits. Keep all appointments.  ?? Keep all medicine out of the reach of children. Never share your medicine with anyone.  Possible Side Effects While Using This Medicine:   Call your doctor right away if you notice any of these side effects:  ?? Allergic reaction: Itching or hives, swelling in your face or hands, swelling or tingling in your mouth or throat, chest tightness, trouble breathing  ?? Blistering, peeling, red skin rash  ?? Fever, joint pain, swelling in the body, unusual weight gain, change in how much or how often you urinate  ?? Joint pain, rash on your cheeks or arms that gets worse in the sun  ?? Seizures, dizziness, uneven heartbeat, muscle cramps or twitching  ?? Severe diarrhea, stomach cramps, fever  ?? Stomach pain, nausea, vomiting, weight loss  If you notice these less serious side effects, talk with your doctor:   ?? Headache  ?? Mild diarrhea or stomach pain  If you notice other side effects that you think are caused by this medicine, tell your doctor.   Call your doctor for medical advice about side effects. You may report side effects to FDA at 1-800-FDA-1088  ?? 2017 [facility] Information is for End User's use only and may not be sold, redistributed or otherwise used for commercial purposes.  The above information is an educational aid only. It is not intended as medical advice for individual conditions or treatments. Talk to your doctor, nurse or pharmacist before following any medical  regimen to see if it is safe and effective for you.  Esomeprazole (By mouth)   Esomeprazole (es-oh-MEP-ra-zole)  Treats gastroesophageal reflux disease (GERD) and a damaged esophagus.  Also treats and helps prevent stomach ulcers. This medicine is a proton pump inhibitor (PPI).   Brand Name(s): Esomeprazole Strontium, NexIUM, NexIUM 24HR, NexIUM 24HR ClearMinis   There may be other brand names for this medicine.  When This Medicine Should Not Be Used:   This medicine is not right for everyone. Do not use it if you had an allergic reaction to esomeprazole or similar medicines.  How to Use This Medicine:   Delayed Release Capsule, Packet  ?? Your doctor will tell you how much medicine to use. Do not use more than directed. Take all of the medicine in your prescription, even if you feel better after a few days.  ?? Follow the instructions on the medicine label if you are using this medicine without a prescription.  ?? Take this medicine at least 1 hour before a meal.  ?? Delayed-release capsule:   ?? Swallow the capsule whole. Do not crush or chew it.  ?? If you cannot swallow the capsule, you may use open it and pour the medicine into 1 tablespoon of applesauce. Stir the mixture well and swallow it right away. Do not chew. Throw away any remaining mixture.  ?? Packets:   ?? 2.5- or 5-milligram (mg) packet: Mix with 1 teaspoon (5 milliliters (mL)) of water and stir well.  ?? 10-, 20-, or 40-mg packet: Mix with 1 tablespoon (15 mL) of water and stir well.  ?? Let the mixture thicken for 2 to 3 minutes. Stir once more, and drink within 30 minutes. Add more water to any leftover medicine and drink it right away.  ?? Feeding tube:   ?? Capsule contents:   ?? Open the capsule and pour the contents into a 60-mL catheter-tipped syringe. Pour 50 mL of water into the syringe. Do not use any other liquids or syringes.  ?? Place the syringe plunger back into the syringe and shake well for 15 seconds. Make sure no medicine is stuck in the tip of the syringe. Inject the medicine into the NG tube right away.  ?? Use water to rinse any leftover medicine through the tube.  ?? Packet contents:    ?? 2.5- or 5-mg packets: Add 5 mL (1 teaspoon) of water into a catheter-tipped syringe. Pour the 2.5- or 5-mg packet into the syringe, shake well right away, and let the medicine thicken for 2 to 3 minutes.  ?? 10-, 20-, or 40-mg packets: Add 15 mL (1 tablespoon) of water into a catheter-tipped syringe. Pour the 10-, 20-, or 40-mg packet into the syringe, shake well right away, and let the medicine thicken for 2 to 3 minutes.  ?? Shake the thickened mixture once more, and inject the medicine into the tube within 30 minutes.  ?? Refill the syringe with the same amount of water that was used in the mixture (5 or 15 mL). Shake it gently, and inject it into the tube to rinse any leftover medicine through the tube.  ?? This medicine should come with a Medication Guide. Ask your pharmacist for a copy if you do not have one.  ?? Missed dose: Take a dose as soon as you remember. If it is almost time for your next dose, wait until then and take a regular dose. Do not take extra medicine to make up for a missed  dose.  ?? Store the medicine in a closed container at room temperature, away from heat, moisture, and direct light.  Drugs and Foods to Avoid:   Ask your doctor or pharmacist before using any other medicine, including over-the-counter medicines, vitamins, and herbal products.  ?? Some foods and medicines can affect how esomeprazole works. Tell your doctor if you are using any of the following:  ?? Atazanavir, cilostazol, diazepam, digoxin, erlotinib, ketoconazole, methotrexate, mycophenolate mofetil, nelfinavir, rifampin, saquinavir, St John's wort, tacrolimus, voriconazole  ?? Blood thinner (including clopidogrel or warfarin)  ?? Diuretic (water pill)  ?? Iron supplements  Warnings While Using This Medicine:   ?? Tell your doctor if you are pregnant or breastfeeding, or if you have kidney disease, liver disease, lupus, or osteoporosis.  ?? This medicine may cause the following problems:  ?? Kidney problems   ?? Broken bones in the hip, wrist, or spine  ?? Low levels of vitamin B12 or magnesium in the blood  ?? This medicine can cause diarrhea. Call your doctor if the diarrhea becomes severe, does not stop, or is bloody. Do not take any medicine to stop diarrhea until you have talked to your doctor. Diarrhea can occur 2 months or more after you stop taking this medicine.  ?? Tell any doctor or dentist who treats you that you are using this medicine. This medicine may affect certain medical test results.  ?? Your doctor will check your progress and the effects of this medicine at regular visits. Keep all appointments.  ?? Keep all medicine out of the reach of children. Never share your medicine with anyone.  Possible Side Effects While Using This Medicine:   Call your doctor right away if you notice any of these side effects:  ?? Allergic reaction: Itching or hives, swelling in your face or hands, swelling or tingling in your mouth or throat, chest tightness, trouble breathing  ?? Blistering, peeling, red skin rash  ?? Fast, pounding, or uneven heartbeat  ?? Fever, joint pain, swelling in your body, unusual weight gain, changes in how much or how often you urinate  ?? Joint pain, rash on your cheeks or arms that gets worse in the sun  ?? Seizures, dizziness, muscle cramps, or twitching  ?? Severe diarrhea that does not go away, fever  ?? Stomach pain, nausea, vomiting, weight loss, bloody or black stools  If you notice these less serious side effects, talk with your doctor:   ?? Headache  ?? Mild diarrhea, constipation, or gas  If you notice other side effects that you think are caused by this medicine, tell your doctor.   Call your doctor for medical advice about side effects. You may report side effects to FDA at 1-800-FDA-1088  ?? 2017 [facility] Information is for End User's use only and may not be sold, redistributed or otherwise used for commercial purposes.   The above information is an educational aid only. It is not intended as medical advice for individual conditions or treatments. Talk to your doctor, nurse or pharmacist before following any medical regimen to see if it is safe and effective for you.   Omeprazole (PriLOSEC, PriLOSEC OTC, Omeclamox-Pak, First - Omeprazole) - (By mouth)   Why this medicine is used:   Treats heartburn, a damaged esophagus, stomach ulcers, and gastroesophageal reflux disease (GERD).  Contact a nurse or doctor right away if you have:  ?? Blistering, peeling, red skin rash  ?? Muscle cramps or twitching, muscle pain or  stiffness, unusual weight gain  ?? Joint pain, rash on your cheeks or arms that gets worse in the sun  ?? Dark-colored urine, change in how much or how often you urinate  ?? Seizures, dizziness, uneven heartbeat  ?? Severe diarrhea, stomach cramps, fever, unusual tiredness or weakness     Common side effects:  ?? Mild diarrhea, stomach pain  ?? Headache  ?? 2017 [facility] Information is for End User's use only and may not be sold, redistributed or otherwise used for commercial purposes.

## 2016-10-01 LAB — H. PYLORI BREATH TEST: H. pylori Breath Test: NEGATIVE

## 2016-10-03 NOTE — Telephone Encounter (Signed)
-----   Message from Pauline AusLuisa Salazar sent at 10/03/2016  3:17 PM EST -----  Regarding: TEST RESULTS   Contact: [phone]  TEST RESULTS

## 2016-10-09 ENCOUNTER — Encounter: Attending: Family

## 2017-01-22 ENCOUNTER — Ambulatory Visit: Admit: 2017-01-22 | Discharge: 2017-01-22 | Payer: MEDICAID | Attending: Family

## 2017-01-22 ENCOUNTER — Encounter: Admit: 2017-01-22 | Discharge: 2017-01-27 | Payer: MEDICAID

## 2017-01-22 ENCOUNTER — Encounter: Attending: Family

## 2017-01-22 DIAGNOSIS — E039 Hypothyroidism, unspecified: Secondary | ICD-10-CM

## 2017-01-22 LAB — METABOLIC PANEL, COMPREHENSIVE
ALT (SGPT): 24 U/L — ABNORMAL LOW (ref 30–65)
AST (SGOT): 20 iu/l (ref 15–37)
Albumin: 3.7 g/dl (ref 3.4–5.0)
Alk. phosphatase: 80 U/L (ref 50–136)
BUN/Creatinine ratio: 14 IU/ml
BUN: 12 mg/dL (ref 7–18)
Bilirubin, total: 0.2 mg/dl (ref 0.00–1.00)
CO2: 23 mmol/L (ref 22–32)
Calcium: 8.6 mg/dl (ref 7.8–9.7)
Chloride: 104 mmol/l (ref 98–109)
Creatinine: 0.8 mg/dl (ref 0.6–1.3)
EGFRAA: 106 (ref >60)
Glucose: 81 mg/dL (ref 74–106)
Potassium: 4 mml/l (ref 3.5–5.1)
Protein, total: 7.6 g/dl (ref 6.4–9.0)
Sodium: 141 mml/L (ref 136–145)
eGFR: 88 (ref >60)

## 2017-01-22 LAB — CBC WITH AUTOMATED DIFF
ABS. GRANULOCYTES: 2.4 10*3/uL (ref 2.0–7.8)
ABS. LYMPHOCYTES: 1.3 10*3/uL (ref 0.6–4.1)
ABSOLUTE MID: 0.4 10*3/uL (ref 0.0–1.8)
GRANULOCYTES: 59.3 % (ref 37.0–92.0)
HCT: 39.5 % (ref 37.0–51.0)
HGB: 12.5 g/dL (ref 11.5–14.5)
LYMPHOCYTES: 31.3 % (ref 10.0–58.8)
MCH: 26.3 pg (ref 26.0–32.0)
MCHC: 31.6 g/dL (ref 31.0–36.0)
MCV: 83.1 fL — ABNORMAL LOW (ref 88.0–98.0)
MEAN PLATELET VOLUME: 7.4 fl (ref 0.0–49.9)
MID %: 9.4 % (ref 0.1–15.0)
PLATELET: 294 10*3/uL (ref 150–350)
RBC: 4.75 10*6/uL (ref 4.20–6.30)
RDW: 11.2 % — ABNORMAL LOW (ref 11.5–14.5)
WBC: 4 10*3/uL — ABNORMAL LOW (ref 4.1–10.9)

## 2017-01-22 LAB — TSH 3RD GENERATION: TSH: 4.38 ng/dL (ref 0.40–4.50)

## 2017-01-22 LAB — HCG URINE, QL: HCG urine, QL: NEGATIVE

## 2017-01-22 LAB — T4, FREE: T4, Free: 0.89 ng/dL (ref 0.59–1.17)

## 2017-01-22 MED ORDER — LEVOTHYROXINE 75 MCG TAB
75 mcg | ORAL_TABLET | Freq: Every day | ORAL | 0 refills | Status: DC
Start: 2017-01-22 — End: 2017-06-14

## 2017-01-22 NOTE — Patient Instructions (Addendum)
Implant for Birth Control: Care Instructions  Your Care Instructions    The implant is used to prevent pregnancy. It's a thin rod about the size of a matchstick that is inserted under the skin (subdermal) on the inside of your arm.  The implant prevents pregnancy for 3 years. After it is put in, you don't have to do anything else to prevent pregnancy.  Follow-up care is a key part of your treatment and safety. Be sure to make and go to all appointments, and call your doctor if you are having problems. It's also a good idea to know your test results and keep a list of the medicines you take.  How can you care for yourself at home?  How do you use the subdermal implant?  ?? The implant is put in and taken out by your doctor or another trained health professional. This is done in your doctor's office. It only takes a few minutes.  ?? Ask your doctor if you need to use backup birth control, such as a condom. And ask if (and how long) you should avoid intercourse after you get the implant. You may need to do this, depending on where you are in your cycle.  What else do you need to know?  ?? The implant has side effects.  ?? You may have changes in your period. Your period may stop. You may also have spotting or bleeding between periods.  ?? You may have mood changes, less interest in sex, or weight gain.  ?? Remember that 3 years after you receive the implant, you must have it removed or get a new one.  ?? If you don't replace the implant and don't use another form of birth control, you could get pregnant.  ?? If you have the implant removed, you'll have to find another method of birth control. If you don't, you may get pregnant.  ?? Even if you are planning to get pregnant, you have to have the implant removed.  ?? Check with your doctor before you use any other medicines. This includes over-the-counter medicines, vitamins, herbal products, and supplements.  Birth control hormones may not work as well to prevent pregnancy when combined with other medicines.  ?? The implant doesn't protect against sexually transmitted infections (STIs), such as herpes or HIV/AIDS. If you're not sure if your sex partner might have an STI, use a condom to protect against infection.  When should you call for help?  Watch closely for changes in your health, and be sure to contact your doctor if you have any problems.  Where can you learn more?  Go to InsuranceStats.cahttp://www.healthwise.net/GoodHelpConnections.  Enter 762-458-4263V768 in the search box to learn more about "Implant for Birth Control: Care Instructions."  Current as of: February 08, 2016  Content Version: 11.4  ?? 2006-2017 [employer]. Care instructions adapted under license by [employer] (which disclaims liability or warranty for this information). If you have questions about a medical condition or this instruction, always ask your healthcare professional. [employer] disclaims any warranty or liability for your use of this information.       Learning About Birth Control: The Shot  What is the shot?  The shot is used to prevent pregnancy. You get the shot in your upper arm or rear end (buttocks). The shot gives you a dose of the hormone progestin. The shot is often called by its brand name, Depo-Provera.  Progestin prevents pregnancy in these ways: It thickens the mucus in  the cervix. This makes it hard for sperm to travel into the uterus. It also thins the lining of the uterus, which makes it harder for a fertilized egg to attach to the uterus. Progestin can sometimes stop the ovaries from releasing an egg each month (ovulation).  The shot provides birth control for 3 months at a time. You then need another shot.  The shot can cause bone loss. Most women can use it safely for up to 2 years and then may choose to switch to another form of birth control. Some women may be able to use the shot for more than 2 years.   How well does it work?  In the first year of use:  ?? When the shot is used exactly as directed, fewer than 1 woman out of 100 has an unplanned pregnancy.  ?? When the shot is not used exactly as directed, 6 women out of 100 have an unplanned pregnancy.  Be sure to tell your doctor about any health problems you have or medicines you take. He or she can help you choose the birth control method that is right for you.  What are the advantages of the shot?  ?? The shot is one of the most effective methods of birth control.  ?? It's convenient. You need to get a shot only once every 3 months to prevent pregnancy. You don't have to interrupt sex to protect against pregnancy.  ?? It prevents pregnancy for 3 months at a time. You don't have to worry about birth control for this time.  ?? It's safe to use while breastfeeding.  ?? The shot may reduce heavy bleeding and cramping.  ?? The shot doesn't contain estrogen. So you can use it if you don't want to take estrogen or can't take estrogen because you have certain health problems or concerns.  What are the disadvantages of the shot?  ?? The shot doesn't protect against sexually transmitted infections (STIs), such as herpes or HIV/AIDS. If you aren't sure if your sex partner might have an STI, use a condom to protect against disease.  ?? The shot may cause bone loss in some women. You shouldn't use this method for more than 2 years without talking to your doctor first about the risks and benefits.  ?? Any side effects may last up to 3 months.  ?? The shot may cause irregular periods, or you may have spotting between periods. You may also stop getting a period. Some women see having no period as an advantage.  ?? It may cause mood changes, less interest in sex, or weight gain.  ?? You must go to the doctor every 3 months to get the shot.  ?? If you want to get pregnant, it may take 9 to 10 months after you stop getting the shot. This is because the hormones the shot provided have to  leave your system, and your body has to readjust.  ?? If you have severe side effects, you have to wait for the hormones to get out of your system. This may take up to 3 months.  Where can you learn more?  Go to InsuranceStats.cahttp://www.healthwise.net/GoodHelpConnections.  Enter [phone] in the search box to learn more about "Learning About Birth Control: The Shot."  Current as of: February 08, 2016  Content Version: 11.4  ?? 2006-2017 [employer]. Care instructions adapted under license by [employer] (which disclaims liability or warranty for this information). If you have questions about a medical condition  or this instruction, always ask your healthcare professional. Healthwise, Incorporated disclaims any warranty or liability for your use of this information.

## 2017-01-22 NOTE — Progress Notes (Signed)
[facility]  [address]  [place], [place] [address]  Office : [phone]  Fax :  [phone]     01/23/2017    Deborah Barker is a 26 y.o. female who is seen for evaluation of   Chief Complaint   Patient presents with   ??? Thyroid Problem     Pt. states that she would like to get her thyroid levels checked. Pt. also has concerns about her menstrual.       Subjective:  The patient is a 26 y.o. female  who presents for evaluation of thyroid levels with history of abnormal thyroid levels, complains of irregular menstrual periods and fatigue, she is approximately 5 months plus postpartum, thyroid irregularities during pregnancy, is out of medication currently    Review of Systems:  Review of Systems   Constitutional: Positive for malaise/fatigue. Negative for fever.   HENT: Negative for congestion, ear pain and sore throat.    Eyes: Negative for pain and redness.   Respiratory: Negative for cough and shortness of breath.    Cardiovascular: Negative for chest pain, palpitations and leg swelling.   Gastrointestinal: Negative for abdominal pain and nausea.   Genitourinary:        Irregular menstrual periods current calendar shows last cycle 21 days along   Musculoskeletal: Negative for back pain and neck pain.   Neurological: Negative for dizziness and focal weakness.   Endo/Heme/Allergies:        Evaluation of thyroid due to prior history of abnormal thyroid function tests   Psychiatric/Behavioral: Negative for depression and suicidal ideas.       History:  Past Medical History:   Diagnosis Date   ??? Adult hypothyroidism 09/11/2015   ??? Asthma 09/11/2015   ??? BMI 28.0-28.9,adult    ??? Chest pressure    ??? Cough    ??? Dizziness    ??? Fatigue 09/11/2015   ??? Head ache    ??? Left thumb sprain    ??? Sinusitis    ??? Thrombocytopenia (HCC) 09/11/2015       Past Surgical History:   Procedure Laterality Date   ??? HX CESAREAN SECTION  2015    X1        Family History   Problem Relation Age of Onset    ??? Hypertension Mother    ??? Elevated Lipids Brother        Social History   Substance Use Topics   ??? Smoking status: Never Smoker   ??? Smokeless tobacco: Never Used   ??? Alcohol use No       Allergies:  Allergies   Allergen Reactions   ??? Nickel Rash   ??? Suprax [Cefixime] Rash     *CEPHALOSPORINS*  Per mother as a child       Vitals:  Visit Vitals   ??? BP 122/78 (BP 1 Location: Left arm, BP Patient Position: Sitting)   ??? Pulse 84   ??? Temp 98.4 ??F (36.9 ??C) (Tympanic)   ??? Resp 18   ??? Ht 5\' 2"  (1.575 m)   ??? Wt 182 lb (82.6 kg)   ??? LMP 12/23/2016   ??? SpO2 97%   ??? Breastfeeding No   ??? BMI 33.29 kg/m2       Physical Exam:  Physical Exam   Constitutional: She is oriented to person, place, and time. She appears well-developed and well-nourished.   HENT:   Head: Normocephalic.   Nose: No rhinorrhea.   Eyes: Conjunctivae  are normal. Pupils are equal, round, and reactive to light. Right eye exhibits no exudate. Left eye exhibits no exudate.   Neck: Normal range of motion.   Cardiovascular: Normal rate and regular rhythm.    No murmur heard.  Pulmonary/Chest: Effort normal and breath sounds normal. She has no wheezes.   Abdominal: Soft. She exhibits no distension.   Musculoskeletal: Normal range of motion.   Neurological: She is alert and oriented to person, place, and time.   Skin: Skin is warm and dry.   Psychiatric: She has a normal mood and affect.   Nursing note and vitals reviewed.      Assessment and Plan:     Diagnoses and all orders for this visit:    1. Adult hypothyroidism  -     CBC WITH AUTOMATED DIFF; Future  -     METABOLIC PANEL, COMPREHENSIVE; Future  -     T4, FREE; Future  -     TSH 3RD GENERATION; Future  -     levothyroxine (SYNTHROID) 75 mcg tablet; Take 1 Tab by mouth Daily (before breakfast). Indications: hypothyroidism    2. Missed menses  -     HCG URINE, QL; Future      Patient like to make an appointment to discuss birth control, will do so on discharge  Follow-up Disposition:   Return in about 3 months (around 04/21/2017).    All ancillary documentation entered reviewed by provider.       Mirian Capuchinerry L Rovena Hearld, NP

## 2017-01-23 NOTE — Progress Notes (Signed)
Your labs are essentially normal, your thyroid function tests are within normal limits. Please confirm you have been taking thyroid medication since the birth of your child, and have not been out of this medication. If this is so, you can continue taking the levothyroxine at the current level.

## 2017-02-03 ENCOUNTER — Inpatient Hospital Stay
Admit: 2017-02-03 | Discharge: 2017-02-04 | Disposition: A | Discharge status: Home / Self Care | Payer: MEDICAID | Attending: Emergency Medicine

## 2017-02-03 DIAGNOSIS — K219 Gastro-esophageal reflux disease without esophagitis: Secondary | ICD-10-CM

## 2017-02-03 MED ORDER — GI COCKTAIL (GVL)
Freq: Four times a day (QID) | ORAL | Status: DC
Start: 2017-02-03 — End: 2017-02-03
  Administered 2017-02-03: via ORAL

## 2017-02-03 MED FILL — GI COCKTAIL (GVL): ORAL | Qty: 30

## 2017-02-03 NOTE — ED Provider Notes (Signed)
HPI Comments: Patient is a 26 year old female who was diagnosed with gastroesophageal reflux several months ago after the birth of her child.  She was prescribed some omeprazole.  She has not been taking the medication.  Over the past one week she has had some epigastric discomfort and burning.  This morning the symptoms were much more worse and severe.  She was given a GI cocktail prior to my evaluation and states she is feeling better.  She denies any chest pain or dyspnea.  She denies any vomiting or diarrhea.    Patient is a 26 y.o. female presenting with epigastric pain. The history is provided by the patient.   Epigastric Pain    This is a new problem. The current episode started more than 2 days ago. The problem has been gradually worsening. The pain is located in the epigastric region. The quality of the pain is burning. The pain is moderate. Associated symptoms include belching. Pertinent negatives include no fever, no diarrhea, no melena, no nausea, no vomiting, no dysuria, no frequency, no trauma, no chest pain and no back pain. The pain is worsened by eating. The pain is relieved by nothing.        Past Medical History:   Diagnosis Date   ??? Adult hypothyroidism 09/11/2015   ??? Asthma 09/11/2015   ??? BMI 28.0-28.9,adult    ??? Chest pressure    ??? Cough    ??? Dizziness    ??? Fatigue 09/11/2015   ??? Head ache    ??? Left thumb sprain    ??? Sinusitis    ??? Thrombocytopenia (HCC) 09/11/2015       Past Surgical History:   Procedure Laterality Date   ??? HX CESAREAN SECTION  2015    X1          Family History:   Problem Relation Age of Onset   ??? Hypertension Mother    ??? Elevated Lipids Brother        Social History     Social History   ??? Marital status: SINGLE     Spouse name: N/A   ??? Number of children: N/A   ??? Years of education: N/A     Occupational History   ??? Not on file.     Social History Main Topics   ??? Smoking status: Never Smoker   ??? Smokeless tobacco: Never Used   ??? Alcohol use No   ??? Drug use: No    ??? Sexual activity: Yes     Other Topics Concern   ??? Not on file     Social History Narrative         ALLERGIES: Nickel and Suprax [cefixime]    Review of Systems   Constitutional: Negative for fever.   HENT: Negative.    Eyes: Negative.    Respiratory: Negative.    Cardiovascular: Negative for chest pain.   Gastrointestinal: Negative for diarrhea, melena, nausea and vomiting.   Endocrine: Negative.    Genitourinary: Negative for dysuria and frequency.   Musculoskeletal: Negative for back pain.   Skin: Negative.    Neurological: Negative.        Vitals:    02/03/17 1857   BP: (!) 149/95   Pulse: 74   Resp: 16   Temp: 98.6 ??F (37 ??C)   SpO2: 100%   Weight: 81.6 kg (180 lb)   Height: 5\' 2"  (1.575 m)            Physical Exam  Constitutional: She appears well-developed and well-nourished.   HENT:   Head: Normocephalic and atraumatic.   Eyes: EOM are normal. Pupils are equal, round, and reactive to light.   Cardiovascular: Normal rate.    Pulmonary/Chest: Breath sounds normal.   Abdominal: Soft. Bowel sounds are normal. There is tenderness. There is no rebound and no guarding.   Minimal tenderness in the epigastric area without peritoneal signs.   Musculoskeletal: Normal range of motion. She exhibits no edema.   Skin: Skin is warm and dry. No rash noted.   Nursing note and vitals reviewed.       MDM  Number of Diagnoses or Management Options  Diagnosis management comments: Differential diagnosis includes gastritis, pancreatitis, gastroesophageal reflux    EKG shows normal sinus rhythm at a rate of 66 with no acute ischemic changes       Amount and/or Complexity of Data Reviewed  Independent visualization of images, tracings, or specimens: yes    Risk of Complications, Morbidity, and/or Mortality  Presenting problems: low  Diagnostic procedures: low  Management options: low    Patient Progress  Patient progress: stable        ED Course   7:55 PM  She feels better after the GI cocktail here.  She has a bottle of  omeprazole in her purse and I advised her to take it as prescribed.  Clinically her abdomen is benign and I do not feel she needs any imaging at this time we'll treat it as GERD.    Voice dictation software was used during the making of this note.  This software is not perfect and grammatical and other typographical errors may be present.  This note has been proofread, but may still contain errors.  Maryjean KaAnthony W Harolyn Cocker, MD; 02/03/2017 @7 :55 PM   ===================================================================        Procedures

## 2017-02-03 NOTE — ED Notes (Signed)
I have reviewed discharge instructions with the patient.  The patient verbalized understanding.    Patient left ED via Discharge Method: ambulatory to Home with husband and children.    Opportunity for questions and clarification provided.       Patient given 0 scripts.         To continue your aftercare when you leave the hospital, you may receive an automated call from our care team to check in on how you are doing.  This is a free service and part of our promise to provide the best care and service to meet your aftercare needs.??? If you have questions, or wish to unsubscribe from this service please call [phone].  Thank you for Choosing our [facility].

## 2017-02-03 NOTE — ED Triage Notes (Signed)
Pt states that she has been dx with GERD several weeks ago.  Woke up this morning with epigastric pain and burning and has had no relief

## 2017-02-04 LAB — EKG 12-LEAD
Atrial Rate: 66 {beats}/min
Atrial Rate: 66 {beats}/min
Diagnosis: NORMAL
Diagnosis: NORMAL
P Axis: 54 degrees
P Axis: 54 degrees
P-R Interval: 166 ms
P-R Interval: 166 ms
Q-T Interval: 402 ms
Q-T Interval: 402 ms
QRS Duration: 72 ms
QRS Duration: 72 ms
QTc Calculation (Bazett): 421 ms
QTc Calculation (Bazett): 421 ms
R Axis: 38 degrees
R Axis: 38 degrees
T Axis: 39 degrees
T Axis: 39 degrees
Ventricular Rate: 66 {beats}/min
Ventricular Rate: 66 {beats}/min

## 2017-02-04 LAB — EKG, 12 LEAD, INITIAL
Atrial Rate: 66 {beats}/min
Atrial Rate: 66 {beats}/min
Calculated P Axis: 54 degrees
Calculated P Axis: 54 degrees
Calculated R Axis: 38 degrees
Calculated R Axis: 38 degrees
Calculated T Axis: 39 degrees
Calculated T Axis: 39 degrees
Diagnosis: NORMAL
Diagnosis: NORMAL
P-R Interval: 166 ms
P-R Interval: 166 ms
Q-T Interval: 402 ms
Q-T Interval: 402 ms
QRS Duration: 72 ms
QRS Duration: 72 ms
QTC Calculation (Bezet): 421 ms
QTC Calculation (Bezet): 421 ms
Ventricular Rate: 66 {beats}/min
Ventricular Rate: 66 {beats}/min

## 2017-06-11 ENCOUNTER — Other Ambulatory Visit: Admit: 2017-06-11 | Discharge: 2017-06-12 | Payer: MEDICAID

## 2017-06-11 ENCOUNTER — Ambulatory Visit: Admit: 2017-06-11 | Discharge: 2017-06-11 | Payer: MEDICAID | Attending: Family

## 2017-06-11 DIAGNOSIS — R5383 Other fatigue: Secondary | ICD-10-CM

## 2017-06-11 DIAGNOSIS — E039 Hypothyroidism, unspecified: Secondary | ICD-10-CM

## 2017-06-11 LAB — URINALYSIS W/O MICRO
Bilirubin: NEGATIVE mg/dL
Blood: 5.5 (ref 5.0–8.0)
Blood: NEGATIVE ERY/UL
Glucose: NEGATIVE mg/dL
Ketone: NEGATIVE mg/dL
Leukocyte Esterase: NEGATIVE Leu/uL
Nitrites: NEGATIVE
Protein: NEGATIVE mg/dL
Specific gravity: 1.02 (ref 1.000–1.030)
Urobilinogen: 0.2 E.U./dL (ref 0.0–1.0)

## 2017-06-11 LAB — CBC WITH AUTOMATED DIFF
ABS. GRANULOCYTES: 2.2 10*3/uL (ref 2.0–7.8)
ABS. LYMPHOCYTES: 1.2 10*3/uL (ref 0.6–4.1)
ABSOLUTE MID: 0.4 10*3/uL (ref 0.0–1.8)
GRANULOCYTES: 58.7 % (ref 37.0–92.0)
HCT: 38.2 % (ref 37.0–51.0)
HGB: 12.3 g/dL (ref 11.5–14.5)
LYMPHOCYTES: 31.2 % (ref 10.0–58.8)
MCH: 25.3 pg — ABNORMAL LOW (ref 26.0–32.0)
MCHC: 32.2 g/dL (ref 31.0–36.0)
MCV: 78.6 fL — ABNORMAL LOW (ref 88.0–98.0)
MEAN PLATELET VOLUME: 7.4 fl (ref 0.0–49.9)
MID %: 10.1 % (ref 0.1–15.0)
PLATELET: 337 10*3/uL (ref 150–350)
RBC: 4.86 10*6/uL (ref 4.20–6.30)
RDW: 12.7 % (ref 11.5–14.5)
WBC: 3.8 10*3/uL — ABNORMAL LOW (ref 4.1–10.9)

## 2017-06-11 LAB — HCG URINE, QL: HCG urine, QL: POSITIVE — AB

## 2017-06-11 NOTE — Progress Notes (Signed)
lab

## 2017-06-11 NOTE — Progress Notes (Signed)
[facility]  [address]  [place], [place] [id number]  Office : [phone]  Fax :  [phone]     06/14/2017    Deborah Barker is a 26 y.o. female who is seen for evaluation of   Chief Complaint   Patient presents with   ??? Thyroid Problem     f/u       Subjective:  The patient is a 26 y.o. female  who presents for co fatigue and requesting evaluation of thyroid, states has not taken thyroid med for two months, now is feeling some fatigue, states menses  Is also late, she is sexually active, does not use birth control, sp live birth 9-10  months      Review of Systems:  Review of Systems   Constitutional: Positive for malaise/fatigue. Negative for fever.   HENT: Negative for congestion, ear pain and sore throat.    Eyes: Negative for pain and redness.   Respiratory: Negative for cough and shortness of breath.    Cardiovascular: Negative for chest pain, palpitations and leg swelling.   Gastrointestinal: Negative for abdominal pain and nausea.   Musculoskeletal: Negative for back pain, joint pain, myalgias and neck pain.   Neurological: Negative for dizziness and focal weakness.   Psychiatric/Behavioral: Negative for depression and suicidal ideas.       History:  Past Medical History:   Diagnosis Date   ??? Adult hypothyroidism 09/11/2015   ??? Asthma 09/11/2015   ??? BMI 28.0-28.9,adult    ??? Chest pressure    ??? Cough    ??? Dizziness    ??? Fatigue 09/11/2015   ??? Head ache    ??? Left thumb sprain    ??? Sinusitis    ??? Thrombocytopenia (HCC) 09/11/2015       Past Surgical History:   Procedure Laterality Date   ??? HX CESAREAN SECTION  2015    X1        Family History   Problem Relation Age of Onset   ??? Hypertension Mother    ??? Elevated Lipids Brother        Social History   Substance Use Topics   ??? Smoking status: Never Smoker   ??? Smokeless tobacco: Never Used   ??? Alcohol use No       Allergies:  Allergies   Allergen Reactions   ??? Nickel Rash   ??? Suprax [Cefixime] Rash     *CEPHALOSPORINS*  Per mother as a child        Vitals:  Visit Vitals   ??? BP 116/80 (BP 1 Location: Left arm, BP Patient Position: Sitting)   ??? Pulse 94   ??? Temp 98.9 ??F (37.2 ??C) (Tympanic)   ??? Resp 18   ??? Ht 5\' 2"  (1.575 m)   ??? Wt 189 lb (85.7 kg)   ??? LMP 05/09/2017   ??? SpO2 96%   ??? BMI 34.57 kg/m2       Physical Exam:  Physical Exam   Constitutional: She is oriented to person, place, and time. She appears well-developed and well-nourished.   HENT:   Head: Normocephalic.   Nose: No rhinorrhea.   Eyes: Conjunctivae are normal. Pupils are equal, round, and reactive to light. Right eye exhibits no exudate. Left eye exhibits no exudate.   Neck: Normal range of motion.   Cardiovascular: Normal rate and regular rhythm.    No murmur heard.  Pulmonary/Chest: Effort normal and breath sounds normal. She has no wheezes.  Abdominal: Soft. She exhibits no distension.   Musculoskeletal: Normal range of motion.   Neurological: She is alert and oriented to person, place, and time.   Skin: Skin is warm and dry.   Psychiatric: She has a normal mood and affect.   Nursing note and vitals reviewed.      Assessment and Plan:     Diagnoses and all orders for this visit:    1. Adult hypothyroidism  Comments:  no med x 2 months-tsh rising, freet4 wnl  lowered dose, restart med  recheck 3 months  Orders:  -     levothyroxine (SYNTHROID) 50 mcg tablet; Take 1 Tab by mouth Daily (before breakfast).    2. Fatigue, unspecified type  -     CBC WITH AUTOMATED DIFF; Future  -     URINALYSIS W/O MICRO; Future    3. Missed menses  Comments:  lmp 05/09/17  Orders:  -     HCG URINE, QL; Future    4. Positive pregnancy test    Other orders  -     METABOLIC PANEL, COMPREHENSIVE  -     IRON PROFILE  -     T4, FREE  -     TSH 3RD GENERATION        Follow-up Disposition:  Return for will call with lab results and medication doses.    All ancillary documentation entered reviewed by provider.       Mirian Capuchin, NP

## 2017-06-13 LAB — METABOLIC PANEL, COMPREHENSIVE
A-G Ratio: 1.3 (ref 1.2–2.2)
ALT (SGPT): 14 IU/L (ref 0–32)
AST (SGOT): 15 IU/L (ref 0–40)
Albumin: 4.4 g/dL (ref 3.5–5.5)
Alk. phosphatase: 64 IU/L (ref 39–117)
BUN/Creatinine ratio: 8 — ABNORMAL LOW (ref 9–23)
BUN: 6 mg/dL (ref 6–20)
Bilirubin, total: 0.4 mg/dL (ref 0.0–1.2)
CO2: 21 mmol/L (ref 20–29)
Calcium: 9.4 mg/dL (ref 8.7–10.2)
Chloride: 102 mmol/L (ref 96–106)
Creatinine: 0.76 mg/dL (ref 0.57–1.00)
GFR est AA: 126 mL/min/{1.73_m2} (ref >59)
GFR est non-AA: 109 mL/min/{1.73_m2} (ref >59)
GLOBULIN, TOTAL: 3.3 g/dL (ref 1.5–4.5)
Glucose: 82 mg/dL (ref 65–99)
Potassium: 3.9 mmol/L (ref 3.5–5.2)
Protein, total: 7.7 g/dL (ref 6.0–8.5)
Sodium: 138 mmol/L (ref 134–144)

## 2017-06-13 LAB — IRON PROFILE
Iron % saturation: 15 % (ref 15–55)
Iron: 48 ug/dL (ref 27–159)
TIBC: 330 ug/dL (ref 250–450)
UIBC: 282 ug/dL (ref 131–425)

## 2017-06-13 LAB — TSH 3RD GENERATION: TSH: 5.98 u[IU]/mL — ABNORMAL HIGH (ref 0.450–4.500)

## 2017-06-13 LAB — T4, FREE: T4, Free: 1.18 ng/dL (ref 0.82–1.77)

## 2017-06-14 ENCOUNTER — Emergency Department: Admit: 2017-06-15 | Payer: MEDICAID

## 2017-06-14 DIAGNOSIS — O200 Threatened abortion: Secondary | ICD-10-CM

## 2017-06-14 MED ORDER — LEVOTHYROXINE 50 MCG TAB
50 mcg | ORAL_TABLET | Freq: Every day | ORAL | 0 refills | Status: DC
Start: 2017-06-14 — End: 2018-02-03

## 2017-06-14 NOTE — Progress Notes (Signed)
Please be sure patient received the following message on mychart this weekend,   Your lab results show the tsh elevated the free t4 normal, I have sent a prescription for 50 mcg per day which is a lower dose, it appears some supplementation is necessary with the tsh rising though likely a lower dose will be sufficient. Please be sure to return in 3 months for recheck, sooner for any concerns

## 2017-06-14 NOTE — ED Notes (Signed)
Pt given gown to change, with instructions.

## 2017-06-14 NOTE — ED Notes (Signed)
I have reviewed discharge instructions with the patient.  The patient verbalized understanding.    Patient left ED via Discharge Method: ambulatory to Home with (female visitor).    Opportunity for questions and clarification provided.       Patient given 0 scripts.         To continue your aftercare when you leave the hospital, you may receive an automated call from our care team to check in on how you are doing.  This is a free service and part of our promise to provide the best care and service to meet your aftercare needs.??? If you have questions, or wish to unsubscribe from this service please call [phone].  Thank you for Choosing our [facility].

## 2017-06-14 NOTE — ED Triage Notes (Addendum)
Pt states she is app [redacted] weeks pregnant with LMP June 15th. States she is having abd cramping for app 5 days. Pt denies any bleeding at this time but thinks she may have some discharge. Reports she has an appointment with her OB in August and states this is her 3rd baby.

## 2017-06-14 NOTE — ED Provider Notes (Addendum)
HPI Comments: 26 year old female G3 P2 last menstrual period June 15 with occasional suprapubic cramping the last 5 days.  No bleeding or spotting.  Pain somewhat worse tonight.  No fever or vomiting or dysuria.  Patient states she is Rh-    Patient is a 26 y.o. female presenting with pregnancy problem. The history is provided by the patient.   Pregnancy Problem    This is a new problem. The current episode started more than 2 days ago. The problem occurs constantly. The problem has not changed since onset.The pain is located in the suprapubic region. The quality of the pain is dull and cramping. The pain is mild. Pertinent negatives include no fever, no diarrhea, no nausea, no vomiting, no dysuria, no frequency, no chest pain and no back pain. Nothing worsens the pain. The pain is relieved by nothing. The patient's surgical history non-contributory.       Past Medical History:   Diagnosis Date   ??? Adult hypothyroidism 09/11/2015   ??? Asthma 09/11/2015   ??? BMI 28.0-28.9,adult    ??? Chest pressure    ??? Cough    ??? Dizziness    ??? Fatigue 09/11/2015   ??? Head ache    ??? Left thumb sprain    ??? Sinusitis    ??? Thrombocytopenia ([place]) 09/11/2015       Past Surgical History:   Procedure Laterality Date   ??? HX CESAREAN SECTION  2015    X1          Family History:   Problem Relation Age of Onset   ??? Hypertension Mother    ??? Elevated Lipids Brother        Social History     Social History   ??? Marital status: SINGLE     Spouse name: N/A   ??? Number of children: N/A   ??? Years of education: N/A     Occupational History   ??? Not on file.     Social History Main Topics   ??? Smoking status: Never Smoker   ??? Smokeless tobacco: Never Used   ??? Alcohol use No   ??? Drug use: No   ??? Sexual activity: Yes     Other Topics Concern   ??? Not on file     Social History Narrative         ALLERGIES: Nickel and Suprax [cefixime]    Review of Systems   Constitutional: Negative for chills and fever.   Respiratory: Negative for shortness of breath.     Cardiovascular: Negative for chest pain.   Gastrointestinal: Negative for diarrhea, nausea and vomiting.   Genitourinary: Negative for dysuria, frequency, vaginal bleeding and vaginal discharge.   Musculoskeletal: Negative for back pain.       Vitals:    06/14/17 2017   BP: 118/79   Pulse: 91   Resp: 17   Temp: 98.4 ??F (36.9 ??C)   SpO2: 100%   Weight: 81.2 kg (179 lb)   Height: 5' 2"  (1.575 m)            Physical Exam   Constitutional: She is oriented to person, place, and time. She appears well-developed and well-nourished. No distress.   HENT:   Head: Normocephalic and atraumatic.   Right Ear: External ear normal.   Left Ear: External ear normal.   Eyes: Conjunctivae and EOM are normal. Pupils are equal, round, and reactive to light.   Neck: Normal range of motion. Neck supple.   Pulmonary/Chest: Breath  sounds normal. No respiratory distress.   Abdominal: Soft. Bowel sounds are normal. She exhibits no mass. There is no tenderness. There is no rebound and no guarding. No hernia.   Genitourinary: Vagina normal. Uterus is not enlarged and not tender. Cervix exhibits no motion tenderness. Right adnexum displays no mass and no tenderness. Left adnexum displays no mass and no tenderness. No bleeding in the vagina.   Genitourinary Comments: Os closed   Neurological: She is alert and oriented to person, place, and time. Gait normal.   Nl speech   Skin: Skin is warm and dry.   Psychiatric: She has a normal mood and affect. Her speech is normal.   Nursing note and vitals reviewed.       MDM  Number of Diagnoses or Management Options  Diagnosis management comments: Pelvic exam.  Check quantitative hCG.  Suspect ultrasound will be needed.       Amount and/or Complexity of Data Reviewed  Clinical lab tests: ordered and reviewed  Tests in the radiology section of CPT??: reviewed and ordered    Risk of Complications, Morbidity, and/or Mortality  Presenting problems: moderate  Diagnostic procedures: low   Management options: moderate    Patient Progress  Patient progress: stable        ED Course       Procedures      [employer]    Result Date: 06/14/2017  PELVIC ULTRASOUND. HISTORY:  First trimester pelvic pain. COMPARISON: None. The date of the patient's last menstrual period was 5 weeks ago TECHNIQUE:  Transabdominal sector images through a urine distended bladder and high resolution endovaginal 8MHz images of the pelvis. FINDINGS:  The uterus is normal size and measures 14.3 cm x 6.0 cm x 6.8 cm. The endometrial echo stripe is 28 mm wide and contains a tiny hypoechoic structure suggesting a gestational sac.. Right ovary:  4.0 x 1.9  x 3.3 cm. Left ovary:  3.0 x 1.9  x 2.1 cm.     IMPRESSION:  Possible small intrauterine gestational sac. Recommend follow-up ultrasound and quantitative beta hCG levels.   [employer]    Result Date: 06/14/2017  PELVIC ULTRASOUND. HISTORY:  First trimester pelvic pain. COMPARISON: None. The date of the patient's last menstrual period was 5 weeks ago TECHNIQUE:  Transabdominal sector images through a urine distended bladder and high resolution endovaginal 8MHz images of the pelvis. FINDINGS:  The uterus is normal size and measures 14.3 cm x 6.0 cm x 6.8 cm. The endometrial echo stripe is 28 mm wide and contains a tiny hypoechoic structure suggesting a gestational sac.. Right ovary:  4.0 x 1.9  x 3.3 cm. Left ovary:  3.0 x 1.9  x 2.1 cm.     IMPRESSION:  Possible small intrauterine gestational sac. Recommend follow-up ultrasound and quantitative beta hCG levels.       Results Include:    Recent Results (from the past 24 hour(s))   HCG URINE, QL. - POC    Collection Time: 06/14/17  8:42 PM   Result Value Ref Range    Pregnancy test,urine (POC) POSITIVE (A) NEG     CBC WITH AUTOMATED DIFF    Collection Time: 06/14/17  8:57 PM   Result Value Ref Range    WBC 5.5 4.3 - 11.1 K/uL    RBC 4.66 4.05 - 5.25 M/uL    HGB 12.0 11.7 - 15.4 g/dL    HCT 36.3 35.8 - 46.3 %  MCV 77.9 (L) 79.6 - 97.8 FL    MCH 25.8 (L) 26.1 - 32.9 PG    MCHC 33.1 31.4 - 35.0 g/dL    RDW 14.7 (H) 11.9 - 14.6 %    PLATELET 290 150 - 450 K/uL    MPV 9.8 (L) 10.8 - 14.1 FL    DF AUTOMATED      NEUTROPHILS 64 43 - 78 %    LYMPHOCYTES 28 13 - 44 %    MONOCYTES 6 4.0 - 12.0 %    EOSINOPHILS 2 0.5 - 7.8 %    BASOPHILS 0 0.0 - 2.0 %    IMMATURE GRANULOCYTES 0 0.0 - 5.0 %    ABS. NEUTROPHILS 3.5 1.7 - 8.2 K/UL    ABS. LYMPHOCYTES 1.5 0.5 - 4.6 K/UL    ABS. MONOCYTES 0.3 0.1 - 1.3 K/UL    ABS. EOSINOPHILS 0.1 0.0 - 0.8 K/UL    ABS. BASOPHILS 0.0 0.0 - 0.2 K/UL    ABS. IMM. GRANS. 0.0 0.0 - 0.5 K/UL   METABOLIC PANEL, COMPREHENSIVE    Collection Time: 06/14/17  8:57 PM   Result Value Ref Range    Sodium 138 136 - 145 mmol/L    Potassium 3.7 3.5 - 5.1 mmol/L    Chloride 102 98 - 107 mmol/L    CO2 26 21 - 32 mmol/L    Anion gap 10 7 - 16 mmol/L    Glucose 85 65 - 100 mg/dL    BUN 11 6 - 23 MG/DL    Creatinine 0.85 0.6 - 1.0 MG/DL    GFR est AA >60 >60 ml/min/1.42m    GFR est non-AA >60 >60 ml/min/1.758m   Calcium 9.0 8.3 - 10.4 MG/DL    Bilirubin, total 0.2 0.2 - 1.1 MG/DL    ALT (SGPT) 22 12 - 65 U/L    AST (SGOT) 17 15 - 37 U/L    Alk. phosphatase 62 50 - 136 U/L    Protein, total 8.1 6.3 - 8.2 g/dL    Albumin 3.8 3.5 - 5.0 g/dL    Globulin 4.3 (H) 2.3 - 3.5 g/dL    A-G Ratio 0.9 (L) 1.2 - 3.5     BETA HCG, QT    Collection Time: 06/14/17  8:57 PM   Result Value Ref Range    Beta HCG, QT 1711 (H) 0.0 - 6.0 MIU/ML   WET PREP    Collection Time: 06/14/17  9:22 PM   Result Value Ref Range    Special Requests: NO SPECIAL REQUESTS      Wet prep WBC'S  5 TO 10        Wet prep NO YEAST,TRICHOMONAS OR CLUE CELLS NOTED          Patient very minimally tender on examination.  No lateralizing signs.  Favor early pregnancy with threatened miscarriage of her potential for ectopic pregnancy.  Ectopic precautions are given and patient is urged to follow with her OB/GYN doctor on Monday.  If any difficulty getting in  with her doctor on Monday, she should recheck here.      We will check the patient's OB doctor regarding RhoGAM    For her OB/GYN doctor, withhold any RhoGAM unless actual bleeding.

## 2017-06-15 ENCOUNTER — Inpatient Hospital Stay
Admit: 2017-06-15 | Discharge: 2017-06-15 | Disposition: A | Discharge status: Home / Self Care | Payer: MEDICAID | Attending: Emergency Medicine

## 2017-06-15 LAB — BETA HCG, QT
Beta HCG, QT: 1711 m[IU]/mL — ABNORMAL HIGH (ref 0.0–6.0)
hCG Quant: 1711 m[IU]/mL — ABNORMAL HIGH (ref 0.0–6.0)

## 2017-06-15 LAB — CBC WITH AUTOMATED DIFF
ABS. BASOPHILS: 0 10*3/uL (ref 0.0–0.2)
ABS. EOSINOPHILS: 0.1 10*3/uL (ref 0.0–0.8)
ABS. IMM. GRANS.: 0 10*3/uL (ref 0.0–0.5)
ABS. LYMPHOCYTES: 1.5 10*3/uL (ref 0.5–4.6)
ABS. MONOCYTES: 0.3 10*3/uL (ref 0.1–1.3)
ABS. NEUTROPHILS: 3.5 10*3/uL (ref 1.7–8.2)
BASOPHILS: 0 % (ref 0.0–2.0)
EOSINOPHILS: 2 % (ref 0.5–7.8)
HCT: 36.3 % (ref 35.8–46.3)
HGB: 12 g/dL (ref 11.7–15.4)
IMMATURE GRANULOCYTES: 0 % (ref 0.0–5.0)
LYMPHOCYTES: 28 % (ref 13–44)
MCH: 25.8 PG — ABNORMAL LOW (ref 26.1–32.9)
MCHC: 33.1 g/dL (ref 31.4–35.0)
MCV: 77.9 FL — ABNORMAL LOW (ref 79.6–97.8)
MONOCYTES: 6 % (ref 4.0–12.0)
MPV: 9.8 FL — ABNORMAL LOW (ref 10.8–14.1)
NEUTROPHILS: 64 % (ref 43–78)
PLATELET: 290 10*3/uL (ref 150–450)
RBC: 4.66 M/uL (ref 4.05–5.25)
RDW: 14.7 % — ABNORMAL HIGH (ref 11.9–14.6)
WBC: 5.5 10*3/uL (ref 4.3–11.1)

## 2017-06-15 LAB — METABOLIC PANEL, COMPREHENSIVE
A-G Ratio: 0.9 — ABNORMAL LOW (ref 1.2–3.5)
ALT (SGPT): 22 U/L (ref 12–65)
AST (SGOT): 17 U/L (ref 15–37)
Albumin: 3.8 g/dL (ref 3.5–5.0)
Alk. phosphatase: 62 U/L (ref 50–136)
Anion gap: 10 mmol/L (ref 7–16)
BUN: 11 MG/DL (ref 6–23)
Bilirubin, total: 0.2 MG/DL (ref 0.2–1.1)
CO2: 26 mmol/L (ref 21–32)
Calcium: 9 MG/DL (ref 8.3–10.4)
Chloride: 102 mmol/L (ref 98–107)
Creatinine: 0.85 MG/DL (ref 0.6–1.0)
GFR est AA: >60 mL/min/{1.73_m2} (ref >60)
GFR est non-AA: >60 mL/min/{1.73_m2} (ref >60)
Globulin: 4.3 g/dL — ABNORMAL HIGH (ref 2.3–3.5)
Glucose: 85 mg/dL (ref 65–100)
Potassium: 3.7 mmol/L (ref 3.5–5.1)
Protein, total: 8.1 g/dL (ref 6.3–8.2)
Sodium: 138 mmol/L (ref 136–145)

## 2017-06-15 LAB — HCG URINE, QL. - POC: Pregnancy test,urine (POC): POSITIVE — AB

## 2017-06-15 LAB — WET PREP

## 2017-06-16 ENCOUNTER — Encounter: Attending: Women's Health

## 2017-06-16 ENCOUNTER — Encounter: Attending: Obstetrics & Gynecology

## 2017-06-17 ENCOUNTER — Encounter

## 2017-06-17 ENCOUNTER — Other Ambulatory Visit: Admit: 2017-06-17 | Discharge: 2017-06-17 | Payer: MEDICAID | Attending: Women's Health

## 2017-06-17 DIAGNOSIS — O200 Threatened abortion: Secondary | ICD-10-CM

## 2017-06-17 LAB — CHLAMYDIA / GC-AMPLIFIED
Chlamydia trachomatis, NAA: NEGATIVE
Neisseria gonorrhoeae, NAA: NEGATIVE

## 2017-06-17 MED ORDER — DOXYCYCLINE HYCLATE 100 MG CAP
100 mg | ORAL_CAPSULE | Freq: Two times a day (BID) | ORAL | 0 refills | Status: DC
Start: 2017-06-17 — End: 2018-01-01

## 2017-06-17 MED ORDER — PRENATAL VITAMINS-IRON FUMARATE 27 MG IRON-FOLIC ACID 0.8 MG TABLET
27 mg iron- 0.8 mg | ORAL_TABLET | Freq: Every day | ORAL | 3 refills | Status: AC
Start: 2017-06-17 — End: ?

## 2017-06-17 MED ORDER — DOCUSATE SODIUM 100 MG CAP
100 mg | ORAL_CAPSULE | Freq: Two times a day (BID) | ORAL | 6 refills | Status: AC
Start: 2017-06-17 — End: 2017-09-15

## 2017-06-17 NOTE — Progress Notes (Signed)
I have reviewed the patient's visit today including history, exam and assessment by Memory Argueyra Taylor, WHNP-BC.  I agree with treatment/plan as above.    Angela BurkeSamy Robert Orpha Dain, MD  11:19 AM  06/17/17

## 2017-06-17 NOTE — Progress Notes (Signed)
Deborah Barker is a 26 y.o. G3 P2002 who is here today as ER follow up. States she went to ER on 06/14/2017. Had +UPT and +gestational sac on US. Has spotting when wiping as well as cramps 2/10. No fever or chills. No dysuria. BMs 2x/daily. Denies N/V.      ROS:      Constitutional: Negative for chills and fever.     HENT: Negative for severe headaches or vision changes    Respiratory: Negative for cough and shortness of breath.      Cardiovascular: Negative for chest pain and palpitations.     Gastrointestinal: Negative for nausea and vomiting.     Genitourinary: Negative for dysuria and hematuria.     Musculoskeletal: Negative for back pain    Skin: Negative for rash and wound.           Past Medical History:   Diagnosis Date   ??? Adult hypothyroidism 09/11/2015   ??? Asthma 09/11/2015   ??? BMI 28.0-28.9,adult    ??? Chest pressure    ??? Cough    ??? Dizziness    ??? Fatigue 09/11/2015   ??? Head ache    ??? Left thumb sprain    ??? Sinusitis    ??? Thrombocytopenia ([facility]) 09/11/2015       Past Surgical History:   Procedure Laterality Date   ??? HX CESAREAN SECTION  2015    X1        Family History   Problem Relation Age of Onset   ??? Hypertension Mother    ??? Elevated Lipids Brother        Social History     Social History   ??? Marital status: SINGLE     Spouse name: N/A   ??? Number of children: N/A   ??? Years of education: N/A     Occupational History   ??? Not on file.     Social History Main Topics   ??? Smoking status: Never Smoker   ??? Smokeless tobacco: Never Used   ??? Alcohol use No   ??? Drug use: No   ??? Sexual activity: Yes     Partners: Male     Birth control/ protection: None     Other Topics Concern   ??? Caffeine Concern No   ??? Exercise No   ??? Seat Belt Yes   ??? Self-Exams No     Social History Narrative    Abuse: Feels safe at home, no history of physical abuse, no history of sexual abuse               Objective:    Visit Vitals   ??? BP 116/76 (BP 1 Location: Left arm, BP Patient Position: Sitting)   ??? Ht 5\' 2"  (1.575 m)    ??? Wt 187 lb 12.8 oz (85.2 kg)   ??? BMI 34.35 kg/m2           Constitutional: She is oriented to person, place, and time and well-developed, well-nourished, and in no distress.     Head: Normocephalic and atraumatic.     Neck: Normal range of motion. Neck supple. No thyromegaly present.     Cardiovascular: Normal rate and regular rhythm.      Pulmonary: Effort normal and breath sounds normal.    Breast: The breasts exhibit no masses, no skin changes and no tenderness.     Abdominal: Soft. There is no tenderness. There is no rebound.     Pelvic Exam:  External: normal female genitalia without lesions or masses       Vagina: normal without lesions or masses, no discharge noted      Cervix: dilated approx 2cm with large amount of ?POC/blood clot at OS. Tissue removed by Dr Herold Harms with ring forceps without difficulty. Minimal spotting noted after removal. Pt tolerated well      Adnexa: normal bimanual exam without masses or fullness       Uterus: uterus is normal size, shape, consistency and nontender     Musculoskeletal: Normal range of motion.     Neurological: She is alert and oriented to person, place, and time.     Skin: Skin is warm and dry.             Assessment/Plan            Problem List  Date Reviewed: 07-01-17          Codes Class    Incomplete abortion ICD-10-CM: O03.4  ICD-9-CM: 637.91         Gastroesophageal reflux disease without esophagitis ICD-10-CM: K21.9  ICD-9-CM: 530.81         H/O cesarean section ICD-10-CM: Z98.891  ICD-9-CM: V45.89         Asthma ICD-10-CM: J45.909  ICD-9-CM: 493.90         Adult hypothyroidism ICD-10-CM: E03.9  ICD-9-CM: 244.9         Thrombocytopenia ([facility]) ICD-10-CM: D69.6  ICD-9-CM: 287.5               Problem List Items Addressed This Visit        Other    Incomplete abortion     ?POC's versus blood clot at cervical os with cervix dilated approx 1-2 cm. Tissue sent to pathology.  Quant, CBC, ABORH, progesterone   RTO 1 wk for quant. Emotional support provided today to pt and spouse  Instructed pt to contact the office or seek immediate care if she develops fever gt 101.0, severe lower abdominal pain or heavy vaginal bleeding (soaking 2 or more pads per hour).    Pt co-examined today with Dr Herold Harms           Relevant Orders    SURGICAL PATHOLOGY      Other Visit Diagnoses     Threatened abortion    -  Primary    Relevant Orders    BLOOD TYPE, (ABO+RH)    ANTIBODY SCREEN    CBC W/O DIFF    BETA HCG, QT    PROGESTERONE    BETA HCG, QT          Orders Placed This Encounter   ??? CBC w/o Diff   ??? Quant HCG   ??? Progesterone   ??? Quant HCG   ??? ABO, RH   ??? Ab Screen   ??? prenatal vit-iron fumarate-fa 27 mg iron- 0.8 mg tab tablet   ??? docusate sodium (COLACE) 100 mg capsule   ??? doxycycline (VIBRAMYCIN) 100 mg capsule   ??? Pathology       Outpatient Encounter Prescriptions as of 06/17/2017   Medication Sig Dispense Refill   ??? prenatal vit-iron fumarate-fa 27 mg iron- 0.8 mg tab tablet Take 1 Tab by mouth daily. 90 Tab 3   ??? docusate sodium (COLACE) 100 mg capsule Take 1 Cap by mouth two (2) times a day for 90 days. 60 Cap 6   ??? doxycycline (VIBRAMYCIN) 100 mg capsule Take 1 Cap by mouth two (2) times a day. 14  Cap 0   ??? levothyroxine (SYNTHROID) 50 mcg tablet Take 1 Tab by mouth Daily (before breakfast). 90 Tab 0     No facility-administered encounter medications on file as of 06/17/2017.                Vernell Barrier, NP 06/17/17 11:15 AM

## 2017-06-17 NOTE — Assessment & Plan Note (Addendum)
?  POC's versus blood clot at cervical os with cervix dilated approx 1-2 cm. Tissue sent to pathology.  Quant, CBC, ABORH, progesterone  RTO 1 wk for quant. Emotional support provided today to pt and spouse  Instructed pt to contact the office or seek immediate care if she develops fever gt 101.0, severe lower abdominal pain or heavy vaginal bleeding (soaking 2 or more pads per hour).    Pt co-examined today with Dr Herold HarmsIskandar

## 2017-06-17 NOTE — Patient Instructions (Signed)
Thank you for coming.  Please return in 2 days for labs  Return to the office in 2 weeks for an ultrasound  Please call if you have any abdominal pain and 5 contractions in 1 hour, vaginal bleeding or spotting, or leaking of fluid.

## 2017-06-17 NOTE — Progress Notes (Signed)
Pt is here for a NOB problem visit. Pt states she started spotting on Sunday and cramping on Saturday.  Pt states she continues to have both symptoms.     Visit Vitals   ??? BP 116/76 (BP 1 Location: Left arm, BP Patient Position: Sitting)   ??? Ht 5\' 2"  (1.575 m)   ??? Wt 187 lb 12.8 oz (85.2 kg)   ??? BMI 34.35 kg/m2       LAST PAP:  unsure    LAST MAMMO:  never    LMP:  Patient's last menstrual period was 05/09/2017.    BIRTH CONTROL:  none    FAMILY HISTORY OF:   Breast Cancer:  no   Ovarian Cancer:  no   Uterine Cancer:  no   Colon Cancer:  no    Fetal Movements:  No  Contractions:  No, cramping  Vaginal Bleeding:  spotting  Leaking Fluid:  No  GI/GU issues:  No    Deborah Barker  06/17/17  10:21 AM

## 2017-06-18 ENCOUNTER — Encounter: Attending: Women's Health

## 2017-06-18 ENCOUNTER — Encounter

## 2017-06-18 ENCOUNTER — Inpatient Hospital Stay: Admit: 2017-06-18

## 2017-06-18 LAB — BETA HCG, QT
HCG, BETA, HCGTLT: 1998 m[IU]/mL
HCG, beta, QT: 1998 m[IU]/mL

## 2017-06-18 LAB — BLOOD TYPE, (ABO+RH)
Rh (D): NEGATIVE
Rh Type: NEGATIVE

## 2017-06-18 LAB — CBC W/O DIFF
HCT: 37.2 % (ref 34.0–46.6)
HGB: 11.8 g/dL (ref 11.1–15.9)
MCH: 25.1 pg — ABNORMAL LOW (ref 26.6–33.0)
MCHC: 31.7 g/dL (ref 31.5–35.7)
MCV: 79 fL (ref 79–97)
PLATELET: 297 10*3/uL (ref 150–379)
RBC: 4.71 x10E6/uL (ref 3.77–5.28)
RDW: 15.6 % — ABNORMAL HIGH (ref 12.3–15.4)
WBC: 4.8 10*3/uL (ref 3.4–10.8)

## 2017-06-18 LAB — ANTIBODY SCREEN: Antibody screen: NEGATIVE

## 2017-06-18 LAB — PROGESTERONE: Progesterone: 23 ng/mL

## 2017-06-18 NOTE — Telephone Encounter (Signed)
Please call pt and let her know her blood type did confirm RH neg. She needs to return to the office for a nurse only visit to get rhogam. Pt also needs to return in 1 week for another quant (already scheduled).      I also see pt has appt scheduled for 07/07/2017 for new OB/US. Please cancel this appt. We will schedule her an appt based on her labs next week. Thanks

## 2017-06-18 NOTE — Progress Notes (Signed)
[facility]. Order faxed to [phone] per request.

## 2017-06-18 NOTE — Telephone Encounter (Signed)
[facility] stating that the specimen sent yesterday does not have a specimen source written on the specimen bottle. She is requesting a call back at [phone]. Thanks, Koralynn Greenspan

## 2017-06-19 ENCOUNTER — Ambulatory Visit: Admit: 2017-06-19 | Discharge: 2017-06-19 | Payer: MEDICAID

## 2017-06-19 ENCOUNTER — Encounter

## 2017-06-19 DIAGNOSIS — O034 Incomplete spontaneous abortion without complication: Secondary | ICD-10-CM

## 2017-06-19 NOTE — Telephone Encounter (Signed)
[facility] quant level per Dr. I orders. Pt states she will try to go today or first thing tomorrow morning. Pt's husband has the car at work. Pt inquired again about the specimen and her results. Pt advised that Dr. I will review her results and advise accordingly. Pt states she is no long spotting. Pt verbalized understanding.

## 2017-06-19 NOTE — Telephone Encounter (Signed)
Pt called office with general questions about her recent visit. Pt advised of lab results and appt for Rhogam injection scheduled. Pt ask if the MD was sure it the specimen we collected was everything in her and that she was not still pregnant. Explained to pt, per T. Ladona Ridgelaylor, NP, we are unable to confirm whether it was a missed ab until the pathology results are sent to us.  Pt also advised to keep her appt for her labs to be drawn.  Pt verbalized understanding. All questions addressed at this time.

## 2017-06-19 NOTE — Progress Notes (Signed)
Patient comes in today for her rhogam injection. No complaints/concerns today.     Rhogam  NDC:[id number]  Lot #: [id number]  Exp. Date: 04/05/2018  Site: right GM    Visit Vitals   ??? BP 118/76 (BP 1 Location: Left arm, BP Patient Position: Sitting)   ??? Ht 5\' 2"  (1.575 m)   ??? Wt 186 lb (84.4 kg)   ??? Breastfeeding Unknown   ??? BMI 34.02 kg/m2

## 2017-06-19 NOTE — Telephone Encounter (Signed)
Please contact pt to see if she can go get a quant this afternoon or first thing in the morning.  Please have her contact the office or seek immediate care if you develop fever > 101.0, severe lower abdominal pain or heavy vaginal bleeding (soaking 2 or more pads per hour).

## 2017-06-19 NOTE — Progress Notes (Signed)
HCG quant order per Dr. Charline BillsI.

## 2017-06-20 ENCOUNTER — Ambulatory Visit: Admit: 2017-06-20 | Discharge: 2017-06-20 | Payer: MEDICAID | Attending: Obstetrics & Gynecology

## 2017-06-20 ENCOUNTER — Encounter

## 2017-06-20 DIAGNOSIS — O200 Threatened abortion: Secondary | ICD-10-CM

## 2017-06-20 LAB — BETA HCG, QT
HCG, BETA, HCGTLT: 2106 m[IU]/mL
HCG, beta, QT: 2106 m[IU]/mL

## 2017-06-20 NOTE — Telephone Encounter (Signed)
-----   Message from Angela BurkeSamy Robert Iskandar, MD sent at 06/20/2017  7:58 AM EDT -----  Please see if this patient can come first thing this morning for US and poss discussion of methotrexate therapy.

## 2017-06-20 NOTE — Progress Notes (Signed)
Patient comes in today for ultrasound and follow up. No new complaints/concerns today.    Visit Vitals   ??? BP 134/76 (BP 1 Location: Left arm, BP Patient Position: Sitting)   ??? Ht 5\' 2"  (1.575 m)   ??? Wt 186 lb (84.4 kg)   ??? Breastfeeding No   ??? BMI 34.02 kg/m2

## 2017-06-20 NOTE — Assessment & Plan Note (Addendum)
Lengthy D/W pt about prev US, serial quants and current US  Missed AB (more likely) with minimal increase in quants vs. Poss SAB of second twin  D/W her and husband options:  Cont obs with serial quants vs. cytotec  PLAN:  Repeat quants next week and follow accordingly  Instructed pt to contact the office or seek immediate care if develops fever > 101.0, severe lower abdominal pain or heavy vaginal bleeding (soaking 2 or more pads per hour).

## 2017-06-20 NOTE — Progress Notes (Signed)
Please see if this patient can come first thing this morning for US and poss discussion of methotrexate therapy.

## 2017-06-20 NOTE — Patient Instructions (Signed)
Please come back Tuesday and Thursday for your lab work  Please contact the office or seek immediate care if you develop fever > 101.0, severe lower abdominal pain or heavy vaginal bleeding (soaking 2 or more pads per hour).   Thanks for coming to see us today and letting us take care of you!

## 2017-06-20 NOTE — Progress Notes (Signed)
Assessment/Plan     Problem List  Date Reviewed: 06/14/2017          Codes Class    Threatened abortion ICD-10-CM: O20.0  ICD-9-CM: 640.00     Overview Signed 06/20/2017 11:03 AM by [name], MD     noted             Rh negative, antepartum ICD-10-CM: O09.899, Z67.91  ICD-9-CM: V23.89         Incomplete abortion ICD-10-CM: O03.4  ICD-9-CM: 637.91         Gastroesophageal reflux disease without esophagitis ICD-10-CM: K21.9  ICD-9-CM: 530.81         H/O cesarean section ICD-10-CM: Z98.891  ICD-9-CM: V45.89         Asthma ICD-10-CM: J45.909  ICD-9-CM: 493.90         Adult hypothyroidism ICD-10-CM: E03.9  ICD-9-CM: 244.9         Thrombocytopenia (HCC) ICD-10-CM: D69.6  ICD-9-CM: 287.5              Problem List Items Addressed This Visit        Other    Threatened abortion - Primary     Lengthy D/W pt about prev US, serial quants and current US  Missed AB (more likely) with minimal increase in quants vs. Poss SAB of second twin  D/W her and husband options:  Cont obs with serial quants vs. cytotec  PLAN:  Repeat quants next week and follow accordingly  Instructed pt to contact the office or seek immediate care if develops fever > 101.0, severe lower abdominal pain or heavy vaginal bleeding (soaking 2 or more pads per hour).            Relevant Orders    AMB POC US OB TRANSVAGINAL (Completed)           Subjective     Deborah Barker 26 y.o. G3 P2002 presents today for follow up of suspected abortion.  Pt denies any further bleeding or cramping.  No other issues - GI/GU prob, pelvic pain, F/C.  No CP, SOB         OB History   Gravida Para Term Preterm AB Living   3 2 2   2    SAB TAB Ectopic Molar Multiple Live Births       0 2      # Outcome Date GA Lbr Len/2nd Weight Sex Delivery Anes PTL Lv   3 Term 08/01/16 1978w2d  7 lb 7.4 oz (3.385 kg) F CS-LTranv SPINAL AN N LIV   2 Term 05/24/14 3564w0d  7 lb 2 oz (3.232 kg) F CS-LTranv EPI N LIV      Complications: Failure to Progress in Second Stage   1 Gravida                        Past Medical History:   Diagnosis Date   ??? Adult hypothyroidism 09/11/2015   ??? Asthma 09/11/2015   ??? Blood type, Rh negative    ??? BMI 28.0-28.9,adult    ??? Chest pressure    ??? Cough    ??? Dizziness    ??? Fatigue 09/11/2015   ??? Head ache    ??? Left thumb sprain    ??? Sinusitis    ??? Thrombocytopenia (HCC) 09/11/2015            Past Surgical History:   Procedure Laterality Date   ??? HX CESAREAN SECTION  2015  X1            Family History   Problem Relation Age of Onset   ??? Hypertension Mother    ??? Elevated Lipids Brother            Social History     Social History   ??? Marital status: SINGLE     Spouse name: N/A   ??? Number of children: N/A   ??? Years of education: N/A     Occupational History   ??? Not on file.     Social History Main Topics   ??? Smoking status: Never Smoker   ??? Smokeless tobacco: Never Used   ??? Alcohol use No   ??? Drug use: No   ??? Sexual activity: Yes     Partners: Male     Birth control/ protection: None     Other Topics Concern   ??? Caffeine Concern No   ??? Exercise No   ??? Seat Belt Yes   ??? Self-Exams No     Social History Narrative    Abuse: Feels safe at home, no history of physical abuse, no history of sexual abuse               Allergies   Allergen Reactions   ??? Nickel Rash   ??? Suprax [Cefixime] Rash     *CEPHALOSPORINS*  Per mother as a child           Review of Systems:     Constitutional:  No fevers or chills    Neuro:  No headaches, seizure activity    HEENT: no visual changes, bleeding gums    CV: No chest pain or palpitations    Resp: No SOB or cough    GI:  No nausea/vomiting/diarrhea/constipation    GU: No dysuria or hematuria    MS:  No back, joint pain    Gyn:  As per HPI    Psych:  No depression, anxiety      Objective         Visit Vitals   ??? BP 134/76 (BP 1 Location: Left arm, BP Patient Position: Sitting)   ??? Ht 5\' 2"  (1.575 m)   ??? Wt 186 lb (84.4 kg)   ??? Breastfeeding No   ??? BMI 34.02 kg/m2           Physical Exam:     General:  well developed, well nourished, in no acute distress      Head:   normocephalic and atraumatic     Lungs:  clear bilaterally with normal respirations     Heart:   regular rate and rhythm, S1, S2 without murmurs     Abdomen:  bowel sounds positive; abdomen soft and non-tender without masses, organomegaly, or hernias noted     Pelvic Exam: deferred, no new issues or complaints    Msk:   no deformity or scoliosis noted with normal posture and gait     Skin:   intact without lesions or rashes     Psych:  alert and cooperative; normal mood and affect; normal attention span and concentration          [name], MD     9:05 AM    06/23/17

## 2017-06-20 NOTE — Telephone Encounter (Signed)
Patient was informed of the information. I told her to come on to the office now and we will work her in. Paitent states she had to call her husband to get him to come home to bring her. I advised her we are here to 12 pm. She voiced understanding. mc

## 2017-06-20 NOTE — Progress Notes (Signed)
See telephone message. mc

## 2017-06-24 ENCOUNTER — Encounter: Admit: 2017-06-24 | Discharge: 2017-06-24 | Payer: MEDICAID

## 2017-06-24 ENCOUNTER — Encounter

## 2017-06-24 NOTE — Telephone Encounter (Signed)
Patient was in the office today for her labs and wanted to speak to someone about being out of work. She stated when she was at work she started spotting a lot so she has been calling out and wants to know what Dr. Herold HarmsIskandar would recommend her doing. Thanks!

## 2017-06-24 NOTE — Telephone Encounter (Addendum)
Patient states that she has cramping after being on her feet at work for 5-6 hours. Patient states she did begin to spot the other day. I advised the patient that we do not give a work note unless you are seen in the office. Patient was advised that she can take ibuprofen 600 mg every 6 hrs prn or 800 mg every 8 hrs prn for the cramping. Patient informed that if the pain persist then she will need to be seen. Patient voiced understanding. mc

## 2017-06-25 LAB — BETA HCG, QT
HCG, BETA, HCGTLT: 2541 m[IU]/mL
HCG, beta, QT: 2541 m[IU]/mL

## 2017-06-25 NOTE — Progress Notes (Signed)
Result reviewed with Dr Herold Harmsiskandar. Will compare to quant being drawn tomorrow 06/26/2017

## 2017-06-26 ENCOUNTER — Ambulatory Visit: Admit: 2017-06-26 | Discharge: 2017-06-26 | Payer: MEDICAID

## 2017-06-26 DIAGNOSIS — O200 Threatened abortion: Secondary | ICD-10-CM

## 2017-06-27 LAB — BETA HCG, QT
HCG, BETA, HCGTLT: 2641 m[IU]/mL
HCG, beta, QT: 2641 m[IU]/mL

## 2017-06-30 NOTE — Telephone Encounter (Signed)
Please let pt know that quants are not going up as they should.  This more likely than not represents a blighted ovum/missed AB as we previously dicussed.  I would be happy to talk to pt about findings and treatment options: obs vs. outpt cytotec vs. D&C and risks and benefits for all, including but not limited to: death, bleeding, infection, perforation need for emergent intervention, [facility] and possible further effects on fertility.

## 2017-07-02 ENCOUNTER — Ambulatory Visit: Admit: 2017-07-02 | Discharge: 2017-07-02 | Payer: MEDICAID | Attending: Obstetrics & Gynecology

## 2017-07-02 ENCOUNTER — Encounter: Admit: 2017-07-02 | Discharge: 2017-07-02 | Payer: MEDICAID

## 2017-07-02 ENCOUNTER — Encounter

## 2017-07-02 DIAGNOSIS — O200 Threatened abortion: Secondary | ICD-10-CM

## 2017-07-02 NOTE — Patient Instructions (Signed)
Thank you for coming today  Please contact the office or seek immediate care if you develop a fever higher than 101.0, severe lower abdominal pain or heavy vaginal bleeding (soaking 2 or more pads per hour).  Return in 1 week

## 2017-07-02 NOTE — Progress Notes (Signed)
Azayla Ginyard is a Lisa Roca[age] y.o. G3 P2002 who is here to discuss option for missed ab. Pt wants another US. Unsure of what option to proceed with (expectant management, cytotec or D&C). No VB or cramping        ROS:      Constitutional: Negative for chills and fever.     Cardiovascular: Negative for chest pain and palpitations.     Gastrointestinal: Negative for nausea and vomiting. Negative for diarrhea and constipation    Genitourinary: Negative for dysuria and hematuria.     Musculoskeletal: Negative for back pain            Past Medical History:   Diagnosis Date   ??? Adult hypothyroidism 09/11/2015   ??? Asthma 09/11/2015   ??? Blood type, Rh negative    ??? BMI 28.0-28.9,adult    ??? Chest pressure    ??? Cough    ??? Dizziness    ??? Fatigue 09/11/2015   ??? Head ache    ??? Left thumb sprain    ??? Sinusitis    ??? Thrombocytopenia (HCC) 09/11/2015       Past Surgical History:   Procedure Laterality Date   ??? HX CESAREAN SECTION  2015    X1        Family History   Problem Relation Age of Onset   ??? Hypertension Mother    ??? Elevated Lipids Brother        Social History     Social History   ??? Marital status: SINGLE     Spouse name: N/A   ??? Number of children: N/A   ??? Years of education: N/A     Occupational History   ??? Not on file.     Social History Main Topics   ??? Smoking status: Never Smoker   ??? Smokeless tobacco: Never Used   ??? Alcohol use No   ??? Drug use: No   ??? Sexual activity: Yes     Partners: Male     Birth control/ protection: None     Other Topics Concern   ??? Caffeine Concern No   ??? Exercise No   ??? Seat Belt Yes   ??? Self-Exams No     Social History Narrative    Abuse: Feels safe at home, no history of physical abuse, no history of sexual abuse               Objective:    Visit Vitals   ??? BP 120/70 (BP 1 Location: Left arm, BP Patient Position: Sitting)   ??? Ht 5\' 2"  (1.575 m)   ??? Wt 189 lb (85.7 kg)   ??? Breastfeeding No   ??? BMI 34.57 kg/m2           Constitutional: She is oriented to person, place, and time and  well-developed, well-nourished, and in no distress.     Head: Normocephalic and atraumatic.     Neck: Normal range of motion.     Pulmonary: Effort normal    Musculoskeletal: Normal range of motion.     Neurological: She is alert and oriented to person, place, and time.     Skin: Skin is warm and dry.             Assessment/Plan            Problem List  Date Reviewed: 06/14/2017          Codes Class    Threatened abortion ICD-10-CM: O20.0  ICD-9-CM: 640.00  Overview Signed 06/20/2017 11:03 AM by Angela Burke, MD     noted             Rh negative, antepartum ICD-10-CM: O09.899, Bella.President  ICD-9-CM: V23.89         Incomplete abortion ICD-10-CM: O03.4  ICD-9-CM: 637.91         Gastroesophageal reflux disease without esophagitis ICD-10-CM: K21.9  ICD-9-CM: 530.81         H/O cesarean section ICD-10-CM: Z98.891  ICD-9-CM: V45.89         Asthma ICD-10-CM: J45.909  ICD-9-CM: 493.90         Adult hypothyroidism ICD-10-CM: E03.9  ICD-9-CM: 244.9         Thrombocytopenia (HCC) ICD-10-CM: D69.6  ICD-9-CM: 287.5               Problem List Items Addressed This Visit        Other    Incomplete abortion    Relevant Orders    AMB POC US OB TRANSVAGINAL (Completed)    Threatened abortion - Primary     D/w pt at length findings and treatment options: obs vs. outpt cytotec vs. D&C and risks and benefits, including but not limited to death, bleeding, infection, need for emergent intervention, post-op complications from [facility] and possible affects on fertility.    Pt still undecided. Will discuss and RTO in 1 wk.   [place] today gest sac [redacted]w[redacted]d, yolk sac abnormally large, no fetal pole         Relevant Orders    AMB POC US OB TRANSVAGINAL (Completed)          Orders Placed This Encounter   ??? Transvaginal, OB       Outpatient Encounter Prescriptions as of 07/02/2017   Medication Sig Dispense Refill   ??? prenatal vit-iron fumarate-fa 27 mg iron- 0.8 mg tab tablet Take 1 Tab by mouth daily. 90 Tab 3    ??? docusate sodium (COLACE) 100 mg capsule Take 1 Cap by mouth two (2) times a day for 90 days. 60 Cap 6   ??? levothyroxine (SYNTHROID) 50 mcg tablet Take 1 Tab by mouth Daily (before breakfast). 90 Tab 0   ??? doxycycline (VIBRAMYCIN) 100 mg capsule Take 1 Cap by mouth two (2) times a day. 14 Cap 0     No facility-administered encounter medications on file as of 07/02/2017.                Vernell Barrier, NP 07/02/17 3:29 PM

## 2017-07-02 NOTE — Progress Notes (Signed)
Patient comes in today to discuss her options for her miscarriage. No symptoms since last visit.     Visit Vitals   ??? BP 120/70 (BP 1 Location: Left arm, BP Patient Position: Sitting)   ??? Ht 5\' 2"  (1.575 m)   ??? Wt 189 lb (85.7 kg)   ??? Breastfeeding No   ??? BMI 34.57 kg/m2       Deborah Barker  07/02/17  2:13 PM

## 2017-07-02 NOTE — Progress Notes (Signed)
I have reviewed the patient's visit today including history, exam and assessment by [name], WHNP-BC.  I agree with treatment/plan as above.    Deborah Barker Robert Deborah Base, MD  4:22 PM  07/02/17

## 2017-07-02 NOTE — Assessment & Plan Note (Signed)
D/w pt at length findings and treatment options: obs vs. outpt cytotec vs. D&C and risks and benefits, including but not limited to death, bleeding, infection, need for emergent intervention, [facility] and possible affects on fertility.    Pt still undecided. Will discuss and RTO in 1 wk.   US today gest sac 5129w1d, yolk sac abnormally large, no fetal pole

## 2017-07-07 ENCOUNTER — Encounter: Attending: Obstetrics & Gynecology

## 2017-07-08 ENCOUNTER — Ambulatory Visit: Admit: 2017-07-08 | Discharge: 2017-07-08 | Payer: MEDICAID

## 2017-07-08 DIAGNOSIS — O021 Missed abortion: Secondary | ICD-10-CM

## 2017-07-09 ENCOUNTER — Ambulatory Visit: Admit: 2017-07-09 | Discharge: 2017-07-09 | Payer: MEDICAID | Attending: Obstetrics & Gynecology

## 2017-07-09 ENCOUNTER — Encounter: Admit: 2017-07-09 | Discharge: 2017-07-09 | Payer: MEDICAID

## 2017-07-09 ENCOUNTER — Encounter

## 2017-07-09 DIAGNOSIS — O021 Missed abortion: Secondary | ICD-10-CM

## 2017-07-09 LAB — BETA HCG, QT
HCG, BETA, HCGTLT: 1563 m[IU]/mL
HCG, beta, QT: 1563 m[IU]/mL

## 2017-07-09 NOTE — Telephone Encounter (Signed)
Pt here in office today for office visit with Dr. I. Results discussed with pt by Dr. Charline BillsI.

## 2017-07-09 NOTE — Telephone Encounter (Signed)
Patient quant decreasing - [id number].    The last we saw pt on 8/8 she was undecided on option for missed AB (observation, cyctotec or D&C), Did she start bleeding on her own? We need to continue to watch quants weekly until in returns to zero. Please schedule lab visit.

## 2017-07-09 NOTE — Assessment & Plan Note (Signed)
US reviewed today.  Quants D/W pt at length confirming diagnosis  D/W pt at length findings and treatment options: obs vs. outpt cytotec vs. D&C and risks and benefits for all, including but not limited to: death, bleeding, infection, perforation need for emergent intervention, [facility] and possible further effects on fertility.   Pt would like to "see if it happens naturally" for now  Precautions given to pt

## 2017-07-09 NOTE — Patient Instructions (Signed)
Please contact the office or seek immediate care if you develop fever > 101.0, severe lower abdominal pain or heavy vaginal bleeding (soaking 2 or more pads per hour).   Thanks for coming to see us today and letting us take care of you!

## 2017-07-09 NOTE — Progress Notes (Signed)
Patient comes in today for follow up on threatened miscarriage. Patient states she is having cramping every now and then. Patient states she did notice a brown discharge this morning. No other symptoms per patient.     Visit Vitals   ??? BP 112/74 (BP 1 Location: Right arm, BP Patient Position: Sitting)   ??? Ht 5\' 2"  (1.575 m)   ??? Wt 190 lb (86.2 kg)   ??? Breastfeeding No   ??? BMI 34.75 kg/m2

## 2017-07-09 NOTE — Assessment & Plan Note (Signed)
noted 

## 2017-07-09 NOTE — Progress Notes (Signed)
Assessment/Plan     Problem List  Date Reviewed: Jun 23, 2017          Codes Class    Missed ab ICD-10-CM: O02.1  ICD-9-CM: 632     Overview Signed 06/20/2017 11:03 AM by Angela Burke, MD     noted             Rh negative, antepartum ICD-10-CM: M57.846, Bella.President  ICD-9-CM: V23.89     Overview Signed 07/09/2017  2:08 PM by Angela Burke, MD     Rhogam given 06/24/17             Gastroesophageal reflux disease without esophagitis ICD-10-CM: K21.9  ICD-9-CM: 530.81         H/O cesarean section ICD-10-CM: Z98.891  ICD-9-CM: V45.89         Asthma ICD-10-CM: J45.909  ICD-9-CM: 493.90         Adult hypothyroidism ICD-10-CM: E03.9  ICD-9-CM: 244.9         Thrombocytopenia (HCC) ICD-10-CM: D69.6  ICD-9-CM: 287.5              Problem List Items Addressed This Visit        Other    Rh negative, antepartum     noted         Missed ab - Primary     [place] reviewed today.  Quants D/W pt at length confirming diagnosis  D/W pt at length findings and treatment options: obs vs. outpt cytotec vs. D&C and risks and benefits for all, including but not limited to: death, bleeding, infection, perforation need for emergent intervention, post-op complications from  [facility] and possible further effects on fertility.   Pt would like to "see if it happens naturally" for now  Precautions given to pt                Subjective     Deborah Barker 26 y.o. G3 P2002 presents today for follow up and [place]. Pt notes scant brown D/C this am, none since.  No bleeding, cramping, pelvic pain, vag D/C.  No GI/GU issues.         OB History   Gravida Para Term Preterm AB Living   3 2 2   2    SAB TAB Ectopic Molar Multiple Live Births       0 2      # Outcome Date GA Lbr Len/2nd Weight Sex Delivery Anes PTL Lv   3 Term 08/01/16 [redacted]w[redacted]d  7 lb 7.4 oz (3.385 kg) F CS-LTranv SPINAL AN N LIV   2 Term 05/24/14 [redacted]w[redacted]d  7 lb 2 oz (3.232 kg) F CS-LTranv EPI N LIV      Complications: Failure to Progress in Second Stage   1 Gravida                       Past Medical History:    Diagnosis Date   ??? Adult hypothyroidism 09/11/2015   ??? Asthma 09/11/2015   ??? Blood type, Rh negative    ??? BMI 28.0-28.9,adult    ??? Chest pressure    ??? Cough    ??? Dizziness    ??? Fatigue 09/11/2015   ??? Head ache    ??? Left thumb sprain    ??? Sinusitis    ??? Thrombocytopenia (HCC) 09/11/2015            Past Surgical History:   Procedure Laterality Date   ??? HX CESAREAN SECTION  2015  X1            Family History   Problem Relation Age of Onset   ??? Hypertension Mother    ??? Elevated Lipids Brother            Social History     Social History   ??? Marital status: SINGLE     Spouse name: N/A   ??? Number of children: N/A   ??? Years of education: N/A     Occupational History   ??? Not on file.     Social History Main Topics   ??? Smoking status: Never Smoker   ??? Smokeless tobacco: Never Used   ??? Alcohol use No   ??? Drug use: No   ??? Sexual activity: Yes     Partners: Male     Birth control/ protection: None     Other Topics Concern   ??? Caffeine Concern No   ??? Exercise No   ??? Seat Belt Yes   ??? Self-Exams No     Social History Narrative    Abuse: Feels safe at home, no history of physical abuse, no history of sexual abuse               Allergies   Allergen Reactions   ??? Nickel Rash   ??? Suprax [Cefixime] Rash     *CEPHALOSPORINS*  Per mother as a child           Review of Systems:     Constitutional:  No fevers or chills    Neuro:  No headaches, seizure activity    HEENT: no visual changes, bleeding gums    CV: No chest pain or palpitations    Resp: No SOB or cough    GI:  No nausea/vomiting/diarrhea/constipation    GU: No dysuria or hematuria    MS:  No back, joint pain    Gyn:  As per HPI    Psych:  No depression, anxiety      Objective         Visit Vitals   ??? BP 112/74 (BP 1 Location: Right arm, BP Patient Position: Sitting)   ??? Ht 5\' 2"  (1.575 m)   ??? Wt 190 lb (86.2 kg)   ??? Breastfeeding No   ??? BMI 34.75 kg/m2           Physical Exam:     General:  well developed, well nourished, in no acute distress      Head:   normocephalic and atraumatic     Lungs:  clear bilaterally with normal respirations     Heart:   regular rate and rhythm, S1, S2 without murmurs     Abdomen:  bowel sounds positive; abdomen soft and non-tender without masses, organomegaly, or hernias noted     Pelvic Exam: deferred, prev done, no new issues    Msk:   no deformity or scoliosis noted with normal posture and gait     Skin:   intact without lesions or rashes     Psych:  alert and cooperative; normal mood and affect; normal attention span and concentration          Angela BurkeSamy Robert Kymberlyn Eckford, MD     2:09 PM    07/09/17

## 2017-07-10 NOTE — Telephone Encounter (Signed)
Patient stated that she noticed spotting this morning, but the bleeding has increased through out the day. Patient states she has noticed cramping as well. Patient wants to know if she needs to start doxycycline now or wait. Please advise.

## 2017-07-11 NOTE — Telephone Encounter (Signed)
Patient was informed to not take the medication. Patient states around 10 pm she noticed a hugh blood thing. She thinks she has passed it now she is bleeding like a normal period with cramping. Patient voiced understanding about the medication. mc

## 2017-07-11 NOTE — Telephone Encounter (Signed)
She does not need the medicine at this point

## 2017-07-15 ENCOUNTER — Ambulatory Visit: Admit: 2017-07-15 | Discharge: 2017-07-15 | Payer: MEDICAID

## 2017-07-15 DIAGNOSIS — O021 Missed abortion: Secondary | ICD-10-CM

## 2017-07-16 LAB — BETA HCG, QT
HCG, BETA, HCGTLT: 53 m[IU]/mL
HCG, beta, QT: 53 m[IU]/mL

## 2017-07-16 NOTE — Telephone Encounter (Signed)
Pt advised of lab results. All questions addressed at this time. Lab appt scheduled for 8/28. Verbalized understanding.

## 2017-07-16 NOTE — Telephone Encounter (Signed)
Please let pt know quant significantly decreased to 53. Recommend repeat quant next week. Will continue to check weekly until back to zero.

## 2017-07-22 ENCOUNTER — Encounter

## 2017-08-08 ENCOUNTER — Encounter

## 2017-08-08 NOTE — Telephone Encounter (Signed)
Patient needs an order for her HCG put in so that she can go by [facility] today. Thanks!

## 2017-08-13 LAB — BETA HCG, QT
HCG, BETA, HCGTLT: <1 m[IU]/mL
HCG, beta, QT: <1 m[IU]/mL

## 2017-08-13 MED ORDER — DESOGESTREL-ETHINYL ESTRADIOL 0.15 MG-30 MCG TAB
PACK | Freq: Every day | ORAL | 12 refills | Status: DC
Start: 2017-08-13 — End: 2018-01-01

## 2017-08-13 NOTE — Telephone Encounter (Signed)
I have sent in Rx for Enskyce.  She can start them this Sunday.  Please remind her that she needs to take them around the same time every day.

## 2017-08-13 NOTE — Progress Notes (Signed)
See telephone message. mc

## 2017-08-13 NOTE — Telephone Encounter (Signed)
Patient informed of results and no need for further testing. She would like to do pills for birth control. Patient does not smoke. She started her period on 09/16. Please let me know which pills to give her. Thanks, Aeon Koors

## 2017-08-13 NOTE — Telephone Encounter (Signed)
-----   Message from Angela BurkeSamy Robert Iskandar, MD sent at 08/13/2017  8:21 AM EDT -----  Please let pt know that quant is negative.  No further lab testing needed.  Please make sure she has birth control if she desires

## 2017-08-13 NOTE — Progress Notes (Signed)
Please let pt know that quant is negative.  No further lab testing needed.  Please make sure she has birth control if she desires

## 2017-09-30 ENCOUNTER — Encounter: Attending: Family

## 2018-01-01 ENCOUNTER — Other Ambulatory Visit: Admit: 2018-01-01 | Discharge: 2018-01-01 | Attending: Women's Health

## 2018-01-01 ENCOUNTER — Encounter

## 2018-01-01 DIAGNOSIS — Z3200 Encounter for pregnancy test, result unknown: Secondary | ICD-10-CM

## 2018-01-01 NOTE — Progress Notes (Signed)
This is a 27 y.o.  G4 P2012 at 1548w5d for return OB visit.    Her Estimated Due Date is 08/08/2018, by Last Menstrual Period    Denies leaking of fluid, vaginal bleeding, or regular contractions.  Denies severe or persistent HA, vision changes, RUQ/epigastric pain, N/V or swelling.      Current Outpatient Medications on File Prior to Visit   Medication Sig Dispense Refill   ??? prenatal vit-iron fumarate-fa 27 mg iron- 0.8 mg tab tablet Take 1 Tab by mouth daily. 90 Tab 3   ??? levothyroxine (SYNTHROID) 50 mcg tablet Take 1 Tab by mouth Daily (before breakfast). 90 Tab 0     No current facility-administered medications on file prior to visit.        Allergies   Allergen Reactions   ??? Nickel Rash   ??? Suprax [Cefixime] Rash     *CEPHALOSPORINS*  Per mother as a child       Obstetric History    G4   P2   T2   P0   A1   L2     SAB1   TAB0   Ectopic0   Molar0   Multiple0   Live Births2        #: 1, Date: None, Sex: None, Weight: None, GA: None, Delivery: None, Apgar1: None, Apgar5: None, Living: None, Birth Comments: None    #: 2, Date: 05/24/14, Sex: Female, Weight: 7 lb 2 oz (3.232 kg), GA: 5183w0d, Delivery: C-Section, Low Transverse, Apgar1: None, Apgar5: None, Living: Living, Birth Comments: None    #: 3, Date: 08/01/16, Sex: Female, Weight: 7 lb 7.4 oz (3.385 kg), GA: 6837w2d, Delivery: C-Section, Low Transverse, Apgar1: 8, Apgar5: 9, Living: Living, Birth Comments: None    #: 4, Date: None, Sex: None, Weight: None, GA: None, Delivery: None, Apgar1: None, Apgar5: None, Living: None, Birth Comments: None        Past Medical History:   Diagnosis Date   ??? Adult hypothyroidism 09/11/2015   ??? Asthma 09/11/2015   ??? Blood type, Rh negative    ??? BMI 28.0-28.9,adult    ??? Chest pressure    ??? Cough    ??? Dizziness    ??? Fatigue 09/11/2015   ??? Head ache    ??? Left thumb sprain    ??? Sinusitis    ??? Thrombocytopenia (HCC) 09/11/2015       Past Surgical History:   Procedure Laterality Date   ??? HX CESAREAN SECTION  2015    X1         Family History   Problem Relation Age of Onset   ??? Hypertension Mother    ??? Elevated Lipids Brother        Social History     Socioeconomic History   ??? Marital status: SINGLE     Spouse name: Not on file   ??? Number of children: Not on file   ??? Years of education: Not on file   ??? Highest education level: Not on file   Social Needs   ??? Financial resource strain: Not on file   ??? Food insecurity - worry: Not on file   ??? Food insecurity - inability: Not on file   ??? Transportation needs - medical: Not on file   ??? Transportation needs - non-medical: Not on file   Occupational History   ??? Not on file   Tobacco Use   ??? Smoking status: Never Smoker   ??? Smokeless tobacco: Never Used  Substance and Sexual Activity   ??? Alcohol use: No   ??? Drug use: No   ??? Sexual activity: Yes     Partners: Male     Birth control/protection: None   Other Topics Concern   ??? Military Service Not Asked   ??? Blood Transfusions Not Asked   ??? Caffeine Concern No   ??? Occupational Exposure Not Asked   ??? Hobby Hazards Not Asked   ??? Sleep Concern Not Asked   ??? Stress Concern Not Asked   ??? Weight Concern Not Asked   ??? Special Diet Not Asked   ??? Back Care Not Asked   ??? Exercise No   ??? Bike Helmet Not Asked   ??? Seat Belt Yes   ??? Self-Exams No   Social History Narrative    Abuse: Feels safe at home, no history of physical abuse, no history of sexual abuse           Objective    Visit Vitals  BP 118/70 (BP 1 Location: Left arm, BP Patient Position: Sitting)   Ht 5\' 2"  (1.575 m)   Wt 187 lb (84.8 kg)   Breastfeeding? No   BMI 34.20 kg/m??       General: well developed, well nourished, in no acute distress    Head: normocephalic and atraumatic    Resp: even and unlabored    Psych: Normal mood and affect        Assessment and Plan      Problem List  Date Reviewed: 2018-01-05          Codes Class    Previous cesarean section ICD-10-CM: Z98.891  ICD-9-CM: V45.89     Overview Signed Jan 05, 2018  2:12 PM by Vernell Barrier, NP     Plan repeat when indicated              Multigravida in first trimester ICD-10-CM: Z34.81  ICD-9-CM: V22.1         Thyroid disease during pregnancy, first trimester ICD-10-CM: O99.281, E07.9  ICD-9-CM: 648.13, 246.9     Overview Signed 01-05-18  2:24 PM by Vernell Barrier, NP     05-Jan-2018: reports no meds x 6 months, TSH and T4 today             Rh negative, antepartum ICD-10-CM: O09.899, Z67.91  ICD-9-CM: V23.89     Overview Addendum 01-05-2018  2:12 PM by Vernell Barrier, NP     Plan rhogam at 28 weeks             Gastroesophageal reflux disease without esophagitis ICD-10-CM: K21.9  ICD-9-CM: 530.81         Asthma ICD-10-CM: J45.909  ICD-9-CM: 493.90         Adult hypothyroidism ICD-10-CM: E03.9  ICD-9-CM: 244.9         Thrombocytopenia (HCC) ICD-10-CM: D69.6  ICD-9-CM: 287.5               Problem List Items Addressed This Visit        Endocrine    Thyroid disease during pregnancy, first trimester       Other    Rh negative, antepartum    Previous cesarean section    Multigravida in first trimester     History reviewed  PNV's ordered (if not already taking)  PN labs, UDS ordered  Genetic testing - CF, SMA, tetra at 15 weeks  Problem list reviewed  OB physical completed  OB prenatal packet/education reviewed  RTO 4  weeks  PTL/labor precautions, FMC, and pregnancy warning signs reviewed.             Relevant Orders    ANTIBODY SCREEN    BLOOD TYPE, (ABO+RH)    CBC W/O DIFF    DRUG ABUSE PROF, URINE (SEVEN DRUGS), MS COFIRM    HEMOGLOBIN A1C W/O EAG    HEMOGLOBIN FRACTIONATION    HEP B SURFACE AG    HIV 1/2 AG/AB, 4TH GENERATION,W RFLX CONFIRM    RPR W/REFLEX TITER AND TREPONEMA ABS    RUBELLA AB, IGG    PAP IG, CT-NG-TV, RFX APTIMA HPV ASCUS ([id number])    TSH REFLEX TO T4      Other Visit Diagnoses     Encounter for confirmation of pregnancy test result with physical examination    -  Primary    Relevant Orders    AMB POC US OB < 14 WKS, 1ST GESTATION (Completed)          Orders Placed This Encounter   ??? Fetal & Maternal Eval 1st Gest, <14 Weeks    ??? CBC w/o Diff   ??? DRUG ABUSE PROF, URINE (SEVEN DRUGS), MS COFIRM   ??? HEMOGLOBIN A1C W/O EAG   ??? HEMOGLOBIN FRACTIONATION   ??? HEP B SURFACE AG   ??? HIV 1/2 AG/AB w/ Reflex Confirm   ??? RPR W/REFLEX TITER AND TREPONEMA ABS   ??? Rubella   ??? TSH REFLEX TO T4   ??? Ab Screen   ??? ABO, RH   ??? PAP IG, CT-NG-TV, RFX APTIMA HPV ASCUS       Outpatient Encounter Medications as of 01/01/2018   Medication Sig Dispense Refill   ??? prenatal vit-iron fumarate-fa 27 mg iron- 0.8 mg tab tablet Take 1 Tab by mouth daily. 90 Tab 3   ??? [DISCONTINUED] desogestrel-ethinyl estradiol (ENSKYCE) 0.15-0.03 mg tab Take 1 Tab by mouth daily. 1 Package 12   ??? [DISCONTINUED] doxycycline (VIBRAMYCIN) 100 mg capsule Take 1 Cap by mouth two (2) times a day. 14 Cap 0   ??? levothyroxine (SYNTHROID) 50 mcg tablet Take 1 Tab by mouth Daily (before breakfast). 90 Tab 0     No facility-administered encounter medications on file as of 01/01/2018.                Labor signs, pregnancy warning signs, and fetal movement counting reviewed (if applicable)        Vernell Barrier, NP 01/01/18 2:24 PM

## 2018-01-01 NOTE — Addendum Note (Signed)
Addended by: Vernell BarrierAYLOR, Yuvan Medinger L on: 01/01/2018 02:26 PM     Modules accepted: Orders, Level of Service

## 2018-01-01 NOTE — Progress Notes (Signed)
I have reviewed the patient's visit today including history, exam and assessment by Memory Argueyra Taylor, WHNP-BC.  I agree with treatment/plan as above.    Angela BurkeSamy Robert Ralonda Tartt, MD  2:26 PM  01/01/18

## 2018-01-01 NOTE — Assessment & Plan Note (Signed)
History reviewed  PNV's ordered (if not already taking)  PN labs, UDS ordered  Genetic testing - CF, SMA, tetra at 15 weeks  Problem list reviewed  OB physical completed  OB prenatal packet/education reviewed  RTO 4 weeks  PTL/labor precautions, FMC, and pregnancy warning signs reviewed.

## 2018-01-01 NOTE — Progress Notes (Signed)
Pt is here for an initial prenatal visit and US. Pt denies having any complaints or concerns at this time,     Visit Vitals  BP 118/70 (BP 1 Location: Left arm, BP Patient Position: Sitting)   Wt 187 lb (84.8 kg)   Breastfeeding? No   BMI 34.20 kg/m??     LAST PAP:  unsure    LAST MAMMO:  never    LMP:  Patient's last menstrual period was 11/01/2017 (exact date).    BIRTH CONTROL:  none    FAMILY HISTORY OF:   Breast Cancer:  no   Ovarian Cancer:  no   Uterine Cancer:  no   Colon Cancer:  no    Fetal Movements:  No  Contractions:  No  Vaginal Bleeding:  No  Leaking Fluid:  No  GI/GU issues:  Yes, constipation    Drug/Alcohol 4P's Plus Screening    1.  Have either of your parents ever had a problem with drugs/alcohol/prescription drugs? no  2.  Does your partner have a problem with drugs/alcohol/prescription drugs?  no  3.  In the past, have you ever had a problem with drugs/alcohol/prescription drugs?  no  4.  Before you were pregnant, in the past month, have you done any drugs, drank any alcohol or abused any prescription drugs?  no    If "YES" to any of the above, please give further details:  n/a    Cline CrockKimberly L Glenn-Wright  01/01/18  1:59 PM

## 2018-01-05 LAB — PAP IG, CT-NG-TV, RFX APTIMA HPV ASCUS (199325)
CHLAMYDIA, NUC. ACID AMP, 186134: NEGATIVE
GONOCOCCUS, NUC. ACID AMP, 186135: NEGATIVE
LABCORP 019018: 0
TRICH VAG BY NAA: NEGATIVE

## 2018-01-05 LAB — PAP IG, CT-NG-TV, RFX APTIMA HPV ASCUS (183160,507800)
.: 0
Chlamydia, Nuc. Acid Amp: NEGATIVE
Gonococcus, Nuc. Acid Amp: NEGATIVE
Trich vag by NAA: NEGATIVE

## 2018-01-29 ENCOUNTER — Ambulatory Visit: Admit: 2018-01-29 | Discharge: 2018-01-29 | Attending: Women's Health

## 2018-01-29 DIAGNOSIS — E079 Disorder of thyroid, unspecified: Secondary | ICD-10-CM

## 2018-01-29 MED ORDER — DOCUSATE SODIUM 100 MG CAP
100 mg | ORAL_CAPSULE | Freq: Two times a day (BID) | ORAL | 6 refills | Status: DC
Start: 2018-01-29 — End: 2018-02-03

## 2018-01-29 NOTE — Assessment & Plan Note (Addendum)
PTL/labor precautions, FMC, and pregnancy warning signs reviewed.  RTO 4 weeks for tetra, CF, SMA  Constipation and HA relief measures reviewed

## 2018-01-29 NOTE — Progress Notes (Signed)
This is a 27 y.o.  G4 P2012 at 7139w5d for return OB visit.    Her Estimated Due Date is 08/08/2018, by Last Menstrual Period    Denies leaking of fluid, vaginal bleeding, or regular contractions. Denies severe or persistent HA, vision changes, RUQ/epigastric pain, N/V or swelling.    Pt having BMs every 2-3 days and hard stool. Also HAs daily. Usually resolve by afternoon but at times will return. Has hx headaches prior to pregnancy.     Current Outpatient Medications on File Prior to Visit   Medication Sig Dispense Refill   ??? prenatal vit-iron fumarate-fa 27 mg iron- 0.8 mg tab tablet Take 1 Tab by mouth daily. 90 Tab 3   ??? levothyroxine (SYNTHROID) 50 mcg tablet Take 1 Tab by mouth Daily (before breakfast). 90 Tab 0     No current facility-administered medications on file prior to visit.        Allergies   Allergen Reactions   ??? Nickel Rash   ??? Suprax [Cefixime] Rash     *CEPHALOSPORINS*  Per mother as a child       Obstetric History    G4   P2   T2   P0   A1   L2     SAB1   TAB0   Ectopic0   Molar0   Multiple0   Live Births2        #: 1, Date: None, Sex: None, Weight: None, GA: None, Delivery: None, Apgar1: None, Apgar5: None, Living: None, Birth Comments: None    #: 2, Date: 05/24/14, Sex: Female, Weight: 7 lb 2 oz (3.232 kg), GA: 9326w0d, Delivery: C-Section, Low Transverse, Apgar1: None, Apgar5: None, Living: Living, Birth Comments: None    #: 3, Date: 08/01/16, Sex: Female, Weight: 7 lb 7.4 oz (3.385 kg), GA: 6282w2d, Delivery: C-Section, Low Transverse, Apgar1: 8, Apgar5: 9, Living: Living, Birth Comments: None    #: 4, Date: None, Sex: None, Weight: None, GA: None, Delivery: None, Apgar1: None, Apgar5: None, Living: None, Birth Comments: None        Past Medical History:   Diagnosis Date   ??? Adult hypothyroidism 09/11/2015   ??? Asthma 09/11/2015   ??? Blood type, Rh negative    ??? BMI 28.0-28.9,adult    ??? Chest pressure    ??? Cough    ??? Dizziness    ??? Fatigue 09/11/2015   ??? Head ache    ??? Left thumb sprain     ??? Sinusitis    ??? Thrombocytopenia (HCC) 09/11/2015       Past Surgical History:   Procedure Laterality Date   ??? HX CESAREAN SECTION  2015    X1        Family History   Problem Relation Age of Onset   ??? Hypertension Mother    ??? Elevated Lipids Brother        Social History     Socioeconomic History   ??? Marital status: SINGLE     Spouse name: Not on file   ??? Number of children: Not on file   ??? Years of education: Not on file   ??? Highest education level: Not on file   Social Needs   ??? Financial resource strain: Not on file   ??? Food insecurity - worry: Not on file   ??? Food insecurity - inability: Not on file   ??? Transportation needs - medical: Not on file   ??? Transportation needs - non-medical: Not on file  Occupational History   ??? Not on file   Tobacco Use   ??? Smoking status: Never Smoker   ??? Smokeless tobacco: Never Used   Substance and Sexual Activity   ??? Alcohol use: No   ??? Drug use: No   ??? Sexual activity: Yes     Partners: Male     Birth control/protection: None   Other Topics Concern   ??? Military Service Not Asked   ??? Blood Transfusions Not Asked   ??? Caffeine Concern No   ??? Occupational Exposure Not Asked   ??? Hobby Hazards Not Asked   ??? Sleep Concern Not Asked   ??? Stress Concern Not Asked   ??? Weight Concern Not Asked   ??? Special Diet Not Asked   ??? Back Care Not Asked   ??? Exercise No   ??? Bike Helmet Not Asked   ??? Seat Belt Yes   ??? Self-Exams No   Social History Narrative    Abuse: Feels safe at home, no history of physical abuse, no history of sexual abuse           Objective    Visit Vitals  BP 122/72 (BP 1 Location: Left arm, BP Patient Position: Sitting)   Ht 5\' 2"  (1.575 m)   Wt 188 lb (85.3 kg)   BMI 34.39 kg/m??       General: well developed, well nourished, in no acute distress    Head: normocephalic and atraumatic    Resp: even and unlabored    Psych: Normal mood and affect        Assessment and Plan      Problem List  Date Reviewed: 01/24/18          Codes Class     Previous cesarean section ICD-10-CM: Z98.891  ICD-9-CM: V45.89     Overview Signed Jan 24, 2018  2:12 PM by Vernell Barrier, NP     Plan repeat when indicated             Multigravida in first trimester ICD-10-CM: Z34.81  ICD-9-CM: V22.1     Overview Signed 01/29/2018  2:34 PM by Vernell Barrier, NP     EDD 08/08/2018 by LMP c/w 9 wk [place]             Thyroid disease during pregnancy, first trimester ICD-10-CM: O99.281, E07.9  ICD-9-CM: 648.13, 246.9     Overview Signed 01-24-2018  2:24 PM by Vernell Barrier, NP     01-24-2018: reports no meds x 6 months, TSH and T4 today             Rh negative, antepartum ICD-10-CM: O09.899, Z67.91  ICD-9-CM: V23.89     Overview Addendum January 24, 2018  2:12 PM by Vernell Barrier, NP     Plan rhogam at 28 weeks             Gastroesophageal reflux disease without esophagitis ICD-10-CM: K21.9  ICD-9-CM: 530.81         Asthma ICD-10-CM: J45.909  ICD-9-CM: 493.90         Adult hypothyroidism ICD-10-CM: E03.9  ICD-9-CM: 244.9         Thrombocytopenia (HCC) ICD-10-CM: D69.6  ICD-9-CM: 287.5               Problem List Items Addressed This Visit        Endocrine    Thyroid disease during pregnancy, first trimester     Pt did not get prenatal labs drawn. Stressed importance today. reqs  reprinted            Other    Rh negative, antepartum     noted         Previous cesarean section     noted         Multigravida in first trimester     PTL/labor precautions, FMC, and pregnancy warning signs reviewed.  RTO 4 weeks for tetra, CF, SMA  Constipation and HA relief measures reviewed                   Orders Placed This Encounter   ??? docusate sodium (COLACE) 100 mg capsule       Outpatient Encounter Medications as of 01/29/2018   Medication Sig Dispense Refill   ??? docusate sodium (COLACE) 100 mg capsule Take 1 Cap by mouth two (2) times a day for 90 days. 60 Cap 6   ??? prenatal vit-iron fumarate-fa 27 mg iron- 0.8 mg tab tablet Take 1 Tab by mouth daily. 90 Tab 3    ??? levothyroxine (SYNTHROID) 50 mcg tablet Take 1 Tab by mouth Daily (before breakfast). 90 Tab 0     No facility-administered encounter medications on file as of 01/29/2018.                Labor signs, pregnancy warning signs, and fetal movement counting reviewed (if applicable)        Vernell Barrier, NP 01/29/18 2:34 PM

## 2018-01-29 NOTE — Progress Notes (Signed)
I have reviewed the patient's visit today including history, exam and assessment by [name], WHNP-BC.  I agree with treatment/plan as above.    Tasha Jindra Robert Harim Bi, MD  8:39 AM  02/02/18

## 2018-01-29 NOTE — Assessment & Plan Note (Signed)
noted 

## 2018-01-29 NOTE — Patient Instructions (Addendum)
Thank you for coming.  Return to the office in 4 weeks.  Please call if you have any abdominal pain and 5 contractions in 1 hour, vaginal bleeding or spotting, or leaking of fluid.  There are several changes you can make to prevent and treat constipation.  Drink 8-10 glasses of water every day. Mix a glass of prune juice and apple juice daily.  Eat 2 fruit and 2 veggie servings along with whole grains including fiber cereal every day.  Take a stool softener, such as Colace 100 mg 1 pill, twice a day  Also, Miralax 17 grams of powder in a glass of water daily for 2 weeks, then every other day.  Try the following recommendations to improve your headaches in pregnancy  1.) Drink at least 8- 8oz glasses of water each day  2.) Avoid triggers for headaches (i.e. Certain foods)  3.) Take Tylenol 500 mg 2 tablets every 8 hours as needed. It may help to take your Tylenol with a small amount of caffeinated beverage  4.) Rest in a dark, quiet and cool room

## 2018-01-29 NOTE — Progress Notes (Signed)
Patient comes in today for routine prenatal visit. Patient states she has been having a lot of headaches and constipation.     Fetal Movements:  No  Contractions:  No  Vaginal Bleeding:  No  Leaking Fluid:  No  GI/GU issues:  No    Visit Vitals  BP 122/72 (BP 1 Location: Left arm, BP Patient Position: Sitting)   Ht 5\' 2"  (1.575 m)   Wt 188 lb (85.3 kg)   BMI 34.39 kg/m??         Colby Catanese L Lacara Dunsworth  01/29/18  2:15 PM

## 2018-01-29 NOTE — Assessment & Plan Note (Signed)
Pt did not get prenatal labs drawn. Stressed importance today. reqs reprinted

## 2018-01-30 ENCOUNTER — Encounter: Admit: 2018-01-30 | Discharge: 2018-01-30

## 2018-02-02 LAB — HEMOGLOBIN FRACTIONATION
HEMOGLOBIN A2: 2.1 % (ref 1.8–3.2)
HEMOGLOBIN F: 0 % (ref 0.0–2.0)
HEMOGLOBIN OTHER: 0 %
HEMOGLOBIN S: 0 %
HGB A: 97.9 % (ref 96.4–98.8)
HGB SOLUBILITY: NEGATIVE
Hemoglobin A2: 2.1 % (ref 1.8–3.2)
Hemoglobin A: 97.9 % (ref 96.4–98.8)
Hemoglobin C: 0 %
Hemoglobin C: 0 %
Hemoglobin F: 0 % (ref 0.0–2.0)
Hemoglobin S: 0 %
Hemoglobin, Other: 0 %
Hgb Solubility: NEGATIVE

## 2018-02-02 LAB — BLOOD TYPE, (ABO+RH)
Rh (D): NEGATIVE
Rh Type: NEGATIVE

## 2018-02-02 LAB — RPR W/REFLEX TITER AND TREPONEMA ABS
RPR: NONREACTIVE
RPR: NONREACTIVE

## 2018-02-02 LAB — HIV 1/2 ANTIGEN/ANTIBODY, FOURTH GENERATION W/RFL: HIV Screen 4th Generation wRfx: NONREACTIVE

## 2018-02-02 LAB — HIV 1/2 AG/AB, 4TH GENERATION,W RFLX CONFIRM: HIV SCREEN 4TH GENERATION WRFX: NONREACTIVE

## 2018-02-02 LAB — CBC W/O DIFF
HCT: 34.4 % (ref 34.0–46.6)
HGB: 10.9 g/dL — ABNORMAL LOW (ref 11.1–15.9)
MCH: 24.4 pg — ABNORMAL LOW (ref 26.6–33.0)
MCHC: 31.7 g/dL (ref 31.5–35.7)
MCV: 77 fL — ABNORMAL LOW (ref 79–97)
PLATELET: 334 10*3/uL (ref 150–379)
RBC: 4.46 x10E6/uL (ref 3.77–5.28)
RDW: 16.3 % — ABNORMAL HIGH (ref 12.3–15.4)
WBC: 7.4 10*3/uL (ref 3.4–10.8)

## 2018-02-02 LAB — DRUG ABUSE PROF, URINE (SEVEN DRUGS), MS COFIRM
Amphetamines, urine: NEGATIVE ng/mL
Barbiturates: NEGATIVE ng/mL
Benzodiazepines: NEGATIVE ng/mL
Cannabinoids: NEGATIVE ng/mL
Cocaine: NEGATIVE ng/mL
Opiates: NEGATIVE ng/mL
Phencyclidine: NEGATIVE ng/mL

## 2018-02-02 LAB — THYROXINE (T4): T4, Total: 7.8 ug/dL (ref 4.5–12.0)

## 2018-02-02 LAB — HEMOGLOBIN A1C W/O EAG: Hemoglobin A1c: 5.1 % (ref 4.8–5.6)

## 2018-02-02 LAB — ANTIBODY SCREEN: Antibody screen: NEGATIVE

## 2018-02-02 LAB — TSH REFLEX TO T4: TSH: 5.33 u[IU]/mL — ABNORMAL HIGH (ref 0.450–4.500)

## 2018-02-02 LAB — HEP B SURFACE AG: Hep B surface Ag screen: NEGATIVE

## 2018-02-02 LAB — RUBELLA AB, IGG: Rubella Ab, IgG: 1.13 index (ref >0.99)

## 2018-02-03 MED ORDER — FERROUS SULFATE 325 MG (65 MG ELEMENTAL IRON) TAB, DELAYED RELEASE
325 mg (65 mg iron) | ORAL_TABLET | Freq: Every day | ORAL | 6 refills | Status: AC
Start: 2018-02-03 — End: ?

## 2018-02-03 MED ORDER — LEVOTHYROXINE 50 MCG TAB
50 mcg | ORAL_TABLET | Freq: Every day | ORAL | 6 refills | Status: DC
Start: 2018-02-03 — End: 2018-03-27

## 2018-02-03 MED ORDER — DOCUSATE SODIUM 100 MG CAP
100 mg | ORAL_CAPSULE | Freq: Every day | ORAL | 6 refills | Status: AC | PRN
Start: 2018-02-03 — End: 2018-05-04

## 2018-02-03 NOTE — Telephone Encounter (Signed)
Pt advised of lab results. All questions addressed at this time. rx for Synthroid 50 mcg, Ferrous sulfate 325 mg, and colace 100 mg [facility]. Pt verbalized understanding.

## 2018-02-03 NOTE — Telephone Encounter (Signed)
Please call pt and let her know her TSH was elevated. At her visit, she stated she has not taken her synthroid in approx. 3 months. Please confirm this again. If so she needs to restart Synthroid 50 mcg PO daily before breakfast Disp: 30 RF:6.      Also please add to her next visit a TSH, free T3 and free T4.       Please call patient and let her know that her hemoglobin was low. She needs to start taking FeSO4 325mg  PO ONCE DAILY. Also encourage foods that are high in iron. Patient can take Colace 100mg  PO BID prn for constipation.Encourage an increase in fluids and foods high in fiber to prevent constipation.

## 2018-02-16 NOTE — Telephone Encounter (Addendum)
Patient LM stating she was spotting.    I called her back to discuss the spotting.     Patient stated that the spotting started about 1:30 pm on 02/16/2018. She stated it is bright red spotting when she wipes. She denied any intercourse within last few days, denies being on her feet a lot. Patient stated she has been constipated and gassy.    I discussed this with Memory Argueyra Taylor. Tyra recommendations are that we scheduled the patient for Wednesday for Ultrasound and visit. Patient was informed of the recommendations and scheduled an appointment. Patient also was advised to start a stool softener for constipation. Patient voiced understanding. mc

## 2018-02-18 ENCOUNTER — Ambulatory Visit: Admit: 2018-02-18 | Attending: Obstetrics & Gynecology

## 2018-02-18 ENCOUNTER — Encounter

## 2018-02-18 DIAGNOSIS — O26852 Spotting complicating pregnancy, second trimester: Secondary | ICD-10-CM

## 2018-02-18 NOTE — Assessment & Plan Note (Signed)
noted 

## 2018-02-18 NOTE — Assessment & Plan Note (Addendum)
No evidence of VB on exam or US, continue to monitor  PTL/labor precautions, FMC, and pregnancy warning signs reviewed.  RTO 4 weeks for anatomy [place]

## 2018-02-18 NOTE — Progress Notes (Signed)
This is a 27 y.o.  G4 P2012 at [redacted]w[redacted]d for return OB visit.    Her Estimated Due Date is 08/08/2018, by Last Menstrual Period    Denies leaking of fluid or regular contractions. Pt reports "spotting" on toilet paper once on Monday. Denies intercourse prior. Denies dysuria, urgency, vaginal discharge or odor. Reports fetal flutters. Denies severe or persistent HA, vision changes, RUQ/epigastric pain, N/V or swelling.      Current Outpatient Medications on File Prior to Visit   Medication Sig Dispense Refill   ??? levothyroxine (SYNTHROID) 50 mcg tablet Take 1 Tab by mouth Daily (before breakfast). 30 Tab 6   ??? ferrous sulfate (IRON) 325 mg (65 mg iron) EC tablet Take 1 Tab by mouth daily. 60 Tab 6   ??? docusate sodium (COLACE) 100 mg capsule Take 1 Cap by mouth daily as needed for Constipation for up to 90 days. 60 Cap 6   ??? prenatal vit-iron fumarate-fa 27 mg iron- 0.8 mg tab tablet Take 1 Tab by mouth daily. 90 Tab 3     No current facility-administered medications on file prior to visit.        Allergies   Allergen Reactions   ??? Nickel Rash   ??? Suprax [Cefixime] Rash     *CEPHALOSPORINS*  Per mother as a child       OB History   Gravida Para Term Preterm AB Living   4 2 2  0 1 2   SAB TAB Ectopic Molar Multiple Live Births   1 0 0 0 0 2       #: 1, Date: None, Sex: None, Weight: None, GA: None, Delivery: None, Apgar1: None, Apgar5: None, Living: None, Birth Comments: None    #: 2, Date: 05/24/14, Sex: Female, Weight: 7 lb 2 oz (3.232 kg), GA: [redacted]w[redacted]d, Delivery: C-Section, Low Transverse, Apgar1: None, Apgar5: None, Living: Living, Birth Comments: None    #: 3, Date: 08/01/16, Sex: Female, Weight: 7 lb 7.4 oz (3.385 kg), GA: [redacted]w[redacted]d, Delivery: C-Section, Low Transverse, Apgar1: 8, Apgar5: 9, Living: Living, Birth Comments: None    #: 4, Date: None, Sex: None, Weight: None, GA: None, Delivery: None, Apgar1: None, Apgar5: None, Living: None, Birth Comments: None        Past Medical History:   Diagnosis Date    ??? Adult hypothyroidism 09/11/2015   ??? Asthma 09/11/2015   ??? Blood type, Rh negative    ??? BMI 28.0-28.9,adult    ??? Chest pressure    ??? Cough    ??? Dizziness    ??? Fatigue 09/11/2015   ??? Head ache    ??? Left thumb sprain    ??? Sinusitis    ??? Thrombocytopenia (HCC) 09/11/2015       Past Surgical History:   Procedure Laterality Date   ??? HX CESAREAN SECTION  2015    X1        Family History   Problem Relation Age of Onset   ??? Hypertension Mother    ??? Elevated Lipids Brother        Social History     Socioeconomic History   ??? Marital status: SINGLE     Spouse name: Not on file   ??? Number of children: Not on file   ??? Years of education: Not on file   ??? Highest education level: Not on file   Occupational History   ??? Not on file   Social Needs   ??? Financial resource strain: Not on file   ???  Food insecurity:     Worry: Not on file     Inability: Not on file   ??? Transportation needs:     Medical: Not on file     Non-medical: Not on file   Tobacco Use   ??? Smoking status: Never Smoker   ??? Smokeless tobacco: Never Used   Substance and Sexual Activity   ??? Alcohol use: No   ??? Drug use: No   ??? Sexual activity: Yes     Partners: Male     Birth control/protection: None   Lifestyle   ??? Physical activity:     Days per week: Not on file     Minutes per session: Not on file   ??? Stress: Not on file   Relationships   ??? Social connections:     Talks on phone: Not on file     Gets together: Not on file     Attends religious service: Not on file     Active member of club or organization: Not on file     Attends meetings of clubs or organizations: Not on file     Relationship status: Not on file   ??? Intimate partner violence:     Fear of current or ex partner: Not on file     Emotionally abused: Not on file     Physically abused: Not on file     Forced sexual activity: Not on file   Other Topics Concern   ??? Military Service Not Asked   ??? Blood Transfusions Not Asked   ??? Caffeine Concern No   ??? Occupational Exposure Not Asked    ??? Hobby Hazards Not Asked   ??? Sleep Concern Not Asked   ??? Stress Concern Not Asked   ??? Weight Concern Not Asked   ??? Special Diet Not Asked   ??? Back Care Not Asked   ??? Exercise No   ??? Bike Helmet Not Asked   ??? Seat Belt Yes   ??? Self-Exams No   Social History Narrative    Abuse: Feels safe at home, no history of physical abuse, no history of sexual abuse           Objective    Visit Vitals  BP 114/78 (BP 1 Location: Left arm, BP Patient Position: Sitting)   Ht 5\' 2"  (1.575 m)   Wt 190 lb (86.2 kg)   BMI 34.75 kg/m??       General: well developed, well nourished, in no acute distress    Head: normocephalic and atraumatic    Resp: even and unlabored    Pelvic Exam:       External: normal female genitalia without lesions or masses       Vagina: normal without lesions or masses, no discharge noted      Cervix: normal without lesions or masses       Adnexa: normal bimanual exam without masses or fullness       Uterus: gravid      Psych: Normal mood and affect        Assessment and Plan      Problem List  Date Reviewed: 02/27/18          Codes Class    Previous cesarean section ICD-10-CM: Z66.063  ICD-9-CM: V45.89     Overview Signed 01/01/2018  2:12 PM by Vernell Barrier, NP     Plan repeat when indicated  Multigravida in second trimester ICD-10-CM: Z34.82  ICD-9-CM: V22.1     Overview Addendum 02/18/2018  9:49 AM by Vernell Barrieraylor, Bryson Gavia L, NP     EDD 08/08/2018 by LMP c/w 9 wk US    02/18/2018: Pt declines CF, Tetra, SMA             Thyroid disease during pregnancy, first trimester ICD-10-CM: O99.281, E07.9  ICD-9-CM: 648.13, 246.9     Overview Addendum 02/18/2018  9:42 AM by Vernell Barrieraylor, Tyke Outman L, NP     01/01/2018: reports no meds x 6 months, TSH and T4 today    02/03/2018: Started on Synthroid 50 mcg             Rh negative, antepartum ICD-10-CM: O09.899, Z67.91  ICD-9-CM: V23.89     Overview Addendum 02/18/2018  9:44 AM by Vernell Barrieraylor, Shekera Beavers L, NP     Plan rhogam at 28 weeks    Rhogam 02/18/2018 - (VB), plana again at 28 weeks              Gastroesophageal reflux disease without esophagitis ICD-10-CM: K21.9  ICD-9-CM: 530.81         Asthma ICD-10-CM: J45.909  ICD-9-CM: 493.90         Adult hypothyroidism ICD-10-CM: E03.9  ICD-9-CM: 244.9         Thrombocytopenia (HCC) ICD-10-CM: D69.6  ICD-9-CM: 287.5               Problem List Items Addressed This Visit        Endocrine    Thyroid disease during pregnancy, first trimester       Other    Rh negative, antepartum     noted         Relevant Orders    RHO D IMMUNE GLOBULIN INJ    PR THER/PROPH/DIAG INJECTION, SUBCUT/IM    PR IMMUNIZ ADMIN,1 SINGLE/COMB VAC/TOXOID    Previous cesarean section     noted         Multigravida in second trimester     No evidence of VB on exam or US, continue to monitor  PTL/labor precautions, FMC, and pregnancy warning signs reviewed.  RTO 4 weeks for anatomy US             Relevant Orders    RHO D IMMUNE GLOBULIN INJ    PR THER/PROPH/DIAG INJECTION, SUBCUT/IM    PR IMMUNIZ ADMIN,1 SINGLE/COMB VAC/TOXOID      Other Visit Diagnoses     Spotting affecting pregnancy in second trimester    -  Primary    Relevant Orders    US PREG UTS > 14 WKS SNGL (Completed)          Orders Placed This Encounter   ??? US PREG UTS > 14 WKS SNGL   ??? RHO D IMMUNE GLOBULIN INJ   ??? PR THER/PROPH/DIAG INJECTION, SUBCUT/IM   ??? PR IMM ADMIN, 1 SINGLE/COMBO GREATER THAN 18YO       Outpatient Encounter Medications as of 02/18/2018   Medication Sig Dispense Refill   ??? levothyroxine (SYNTHROID) 50 mcg tablet Take 1 Tab by mouth Daily (before breakfast). 30 Tab 6   ??? ferrous sulfate (IRON) 325 mg (65 mg iron) EC tablet Take 1 Tab by mouth daily. 60 Tab 6   ??? docusate sodium (COLACE) 100 mg capsule Take 1 Cap by mouth daily as needed for Constipation for up to 90 days. 60 Cap 6   ??? prenatal vit-iron fumarate-fa 27 mg iron- 0.8 mg tab tablet  Take 1 Tab by mouth daily. 90 Tab 3     No facility-administered encounter medications on file as of 02/18/2018.                 Labor signs, pregnancy warning signs, and fetal movement counting reviewed (if applicable)        Vernell Barrier, NP 02/18/18 9:50 AM

## 2018-02-18 NOTE — Progress Notes (Signed)
I have reviewed the patient's visit today including history, exam and assessment by [name], WHNP-BC.  I agree with treatment/plan as above.    Janayla Marik Robert Liliani Bobo, MD  11:14 AM  02/18/18

## 2018-02-18 NOTE — Patient Instructions (Signed)
Thank you for coming.  Return to the office in 4 weeks.  Please call if you have any abdominal pain and 5 contractions in 1 hour, vaginal bleeding or spotting, or leaking of fluid.

## 2018-02-18 NOTE — Progress Notes (Signed)
Pt is here for an OBV with complaints of spotting on Monday. Denies having any cramping or other complaints voiced at this time.     Visit Vitals  BP 114/78 (BP 1 Location: Left arm, BP Patient Position: Sitting)   Ht 5\' 2"  (1.575 m)   Wt 190 lb (86.2 kg)   BMI 34.75 kg/m??       Fetal Movements:  flutters  Contractions:  No  Vaginal Bleeding:  spotting  Leaking Fluid:  No  GI/GU issues:  No    Cline CrockKimberly L Glenn-Wright  02/18/18  9:36 AM

## 2018-02-26 ENCOUNTER — Encounter: Attending: Women's Health

## 2018-02-26 LAB — CYSTIC FIBROSIS MUTATION 97
INTERPRETATION, 450021: NOT DETECTED
Interpretation: NOT DETECTED

## 2018-02-26 LAB — SMN1 COPY NUMBER ANALYSIS

## 2018-02-26 LAB — AFP TETRA SCREEN, OBSTETRICAL
AFP MoM: 1.9
AFP Value: 51.6 ng/mL
DIA MoM: 3
DIA Value: 465.72 pg/mL
DSR (2nd Trimester) - 1 in: 10000
DSR (by age) - 1 in: 936
Gest. Age on Collection Date: 15.4 WEEKS
Maternal Age At EDD: 26.8 yr
OSBR Risk - 1 in: 2021
T18 (by age): 1:3648 {titer}
Test results: NEGATIVE
Weight: 190 [lb_av]
hCG MoM: 4.83
hCG Value: 192302 m[IU]/mL
uE3 MoM: 1.37
uE3 Value: 0.84 ng/mL

## 2018-03-17 ENCOUNTER — Encounter: Attending: Women's Health

## 2018-03-18 ENCOUNTER — Encounter: Attending: Women's Health

## 2018-03-27 MED ORDER — LEVOTHYROXINE 50 MCG TAB
50 mcg | ORAL_TABLET | Freq: Every day | ORAL | 6 refills | Status: AC
Start: 2018-03-27 — End: ?

## 2018-03-27 NOTE — Telephone Encounter (Signed)
See portal message from patient. mc

## 2018-03-27 NOTE — Telephone Encounter (Signed)
From: Lisa Roca  To: Vernell Barrier, NP  Sent: 03/27/2018 11:33 AM EDT  Subject: Non-Urgent Medical Question    Hi Mrs. Tyra,   I am almost out of Thyroid Medicine and I can't get to the store to get a refill because I am in [place]. I was wondering is there anyway you can send me a refill to a [facility] here in [place] and I could go pick it up.

## 2018-05-20 ENCOUNTER — Encounter: Attending: Obstetrics & Gynecology

## 2019-05-18 ENCOUNTER — Other Ambulatory Visit (HOSPITAL_COMMUNITY)
Admission: RE | Admit: 2019-05-18 | Discharge: 2019-05-18 | Disposition: A | Discharge status: Home / Self Care | Payer: 59 | Source: Ambulatory Visit | Attending: Family Medicine | Admitting: Family Medicine

## 2019-05-18 ENCOUNTER — Other Ambulatory Visit: Payer: Self-pay | Admitting: Family Medicine

## 2019-05-18 DIAGNOSIS — Z124 Encounter for screening for malignant neoplasm of cervix: Secondary | ICD-10-CM | POA: Insufficient documentation

## 2019-05-20 LAB — CYTOLOGY - PAP
Chlamydia: NEGATIVE
Diagnosis: NEGATIVE
HPV: NOT DETECTED
Neisseria Gonorrhea: NEGATIVE

## 2020-08-16 ENCOUNTER — Ambulatory Visit (INDEPENDENT_AMBULATORY_CARE_PROVIDER_SITE_OTHER): Payer: 59

## 2020-08-16 ENCOUNTER — Ambulatory Visit: Payer: 59 | Admitting: Pulmonary Disease

## 2020-08-16 ENCOUNTER — Other Ambulatory Visit: Payer: Self-pay

## 2020-08-16 ENCOUNTER — Encounter: Payer: Self-pay | Admitting: Pulmonary Disease

## 2020-08-16 VITALS — BP 114/68 | HR 101 | Temp 96.1°F | Ht 62.0 in | Wt 172.6 lb

## 2020-08-16 DIAGNOSIS — R0600 Dyspnea, unspecified: Secondary | ICD-10-CM

## 2020-08-16 DIAGNOSIS — R0609 Other forms of dyspnea: Secondary | ICD-10-CM

## 2020-08-16 IMAGING — DX DG CHEST 2V
2 series · 2 of 2 positions shown · non-contrast
Comparison: No pertinent prior exams are available for comparison.

CLINICAL DATA: Dyspnea on exertion.

EXAM:
CHEST - 2 VIEW

[chest pa]
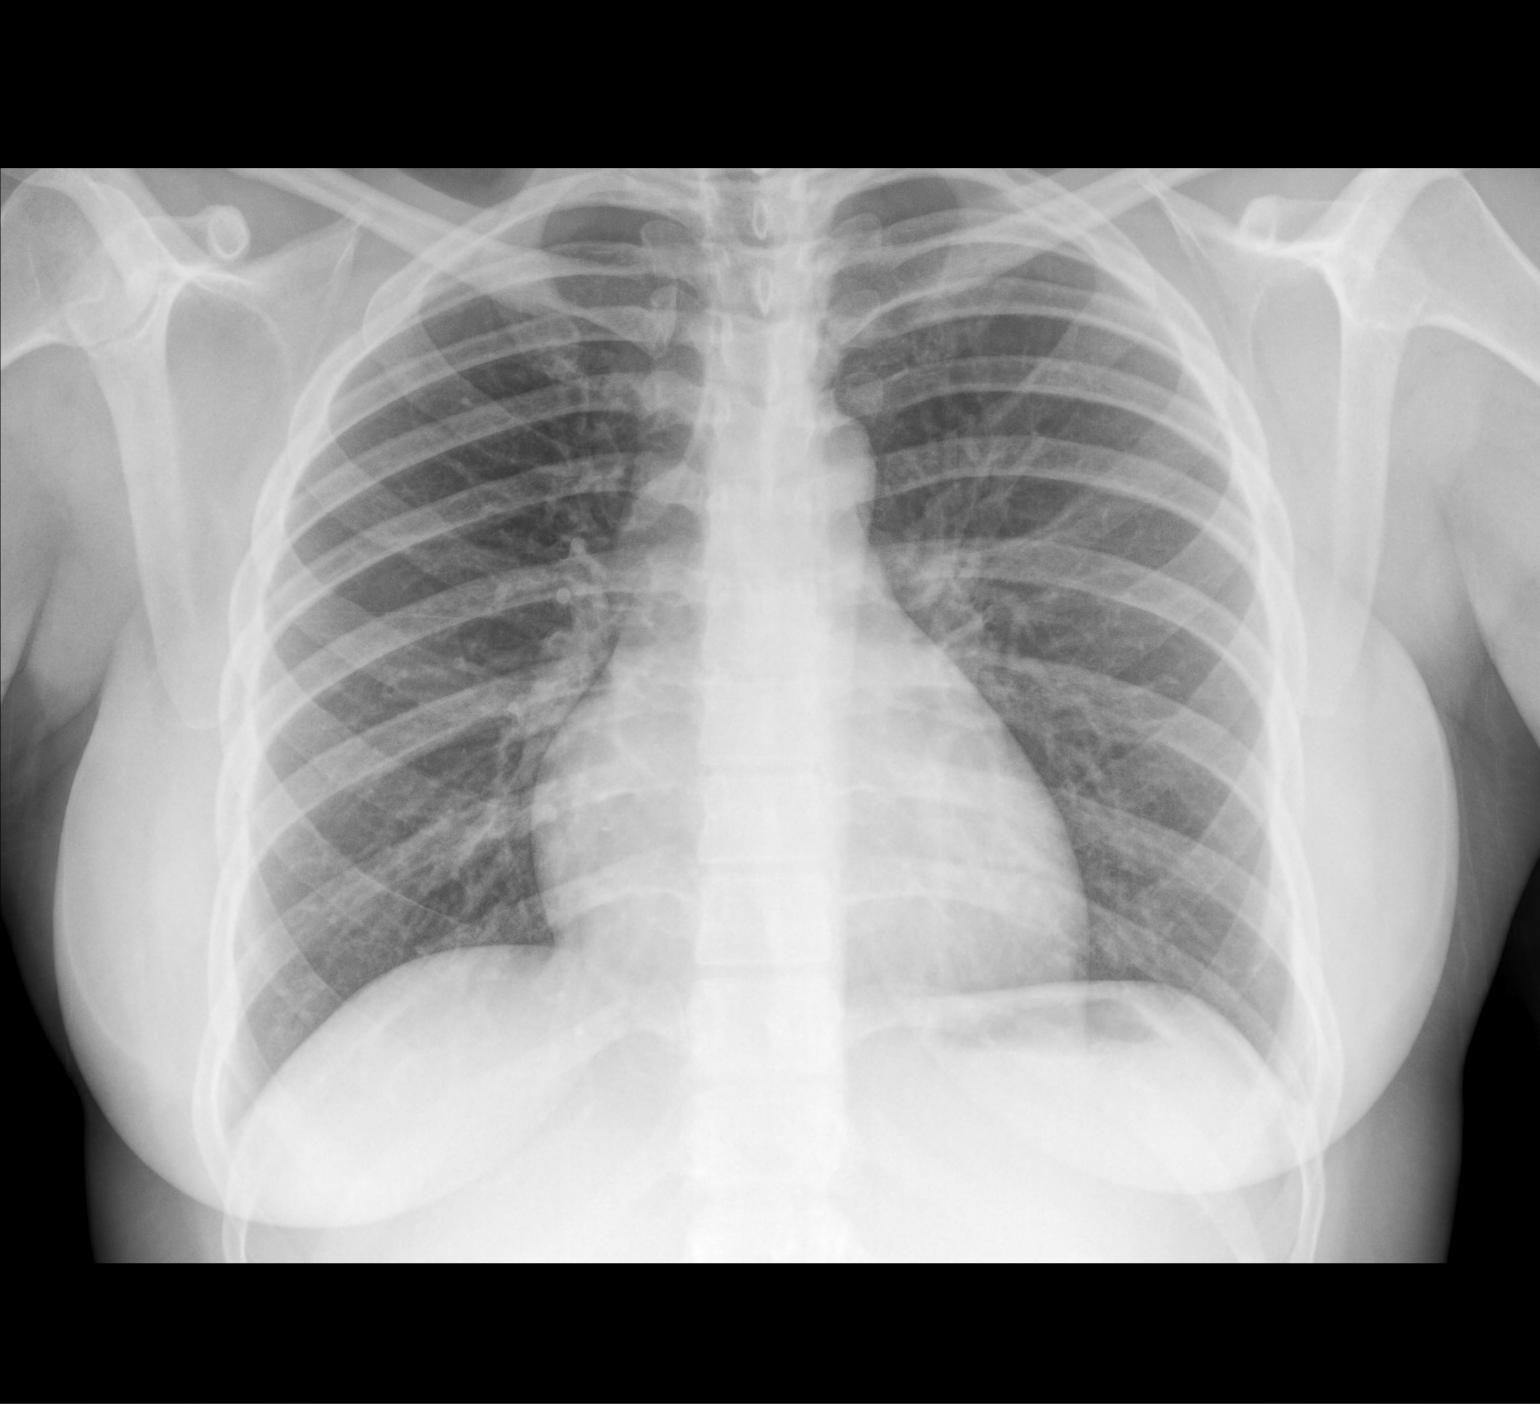

[chest lat]
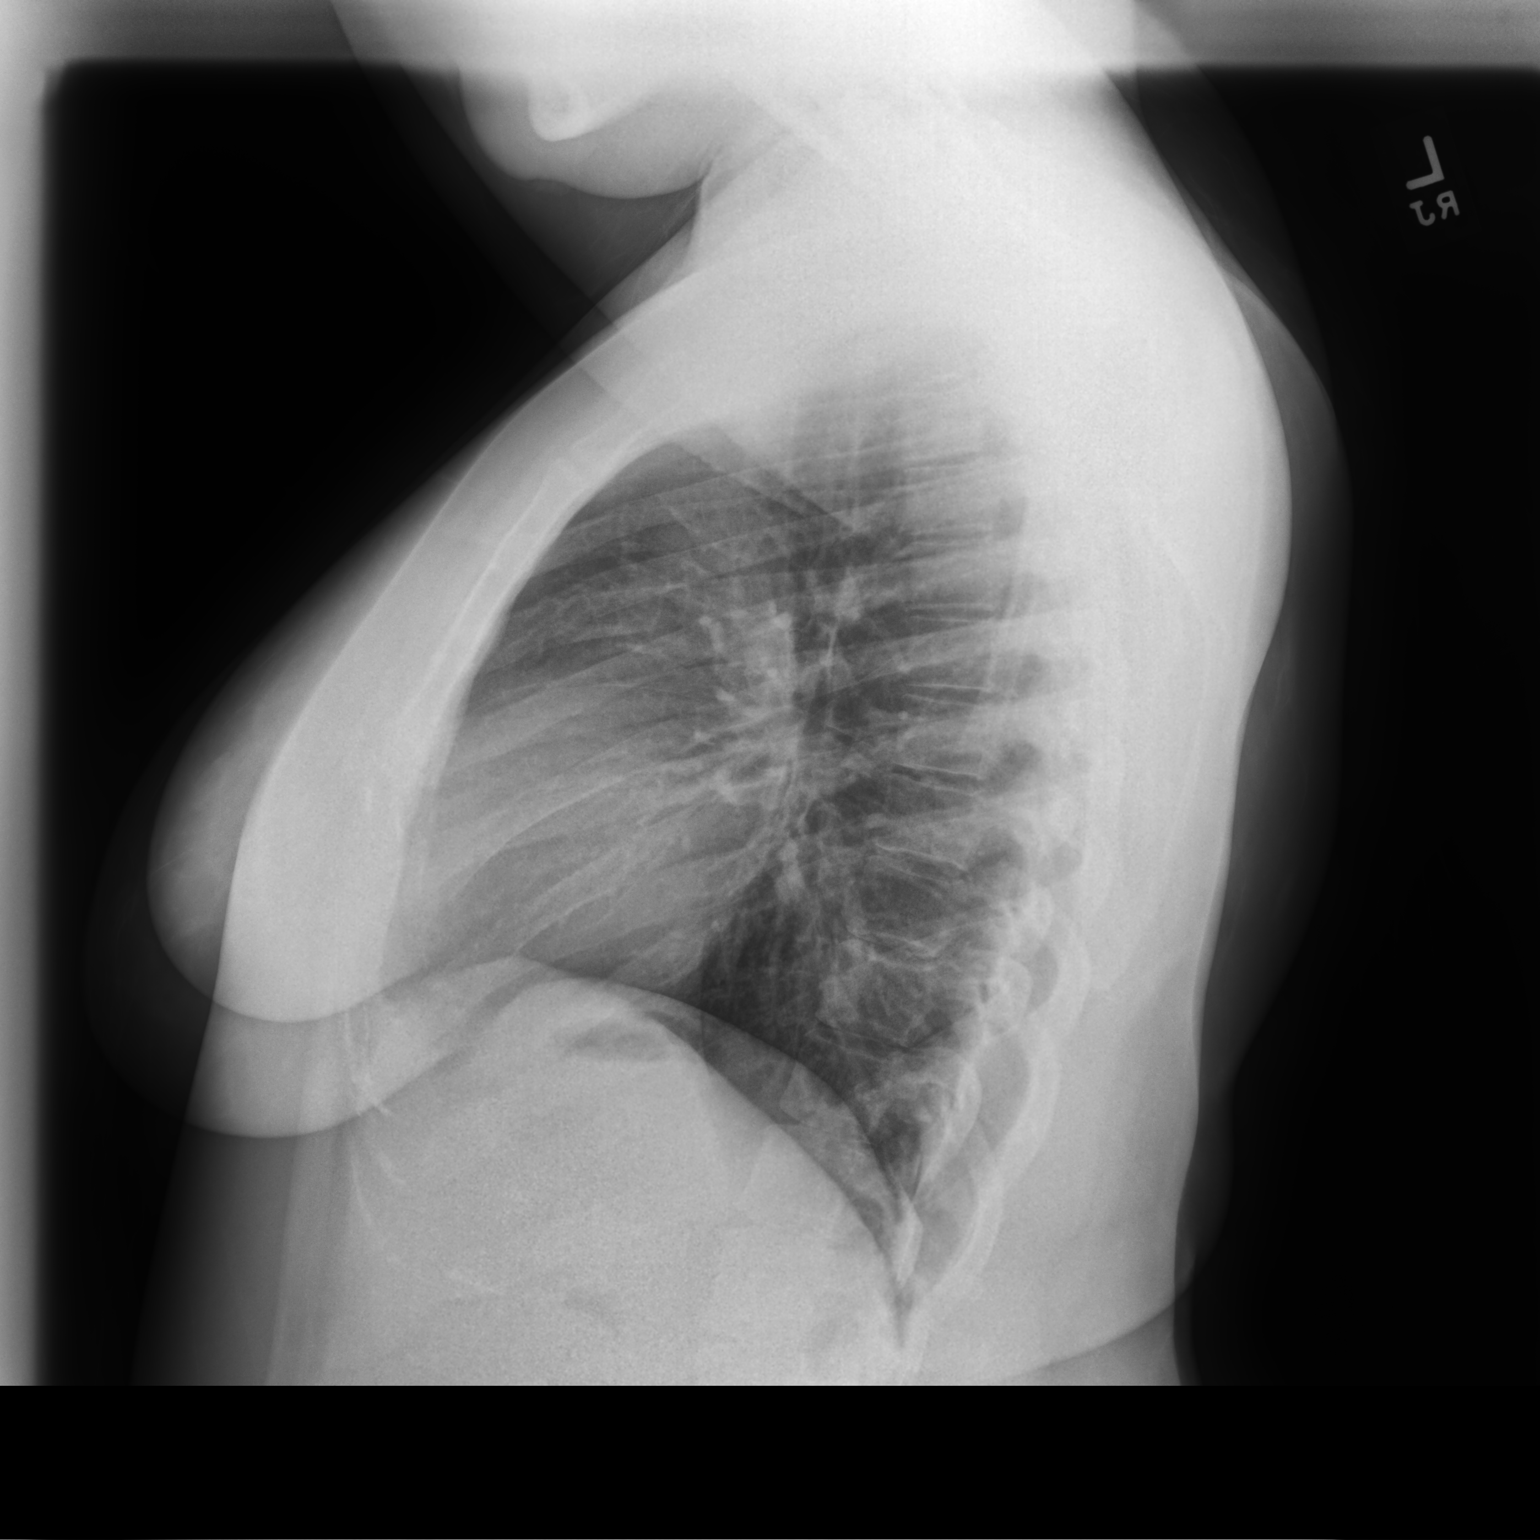

[2 of 2 positions shown; findings below may reference images not displayed]

FINDINGS: Heart size within normal limits.

There is no appreciable airspace consolidation.

No evidence of pleural effusion or pneumothorax.

No acute bony abnormality identified.
IMPRESSION: No evidence of active cardiopulmonary disease.

## 2020-08-16 NOTE — Patient Instructions (Signed)
Continue Symbicort -You may try to stop Symbicort after about a month if symptoms are stable  Continue albuterol -You can use albuterol up to 4 times a day -Pay attention to your technique of using the inhaler as this impacts whether you feel better with using it or not  We will obtain a chest x-ray, a breathing study  I will see you in 3 months  Call with significant concerns  Goal is to keep your symptoms controlled at all times  Albuterol only needs to be used as needed

## 2020-08-16 NOTE — Progress Notes (Signed)
Tracy Hansen    [id number]    [birth year]  Primary Care Physician:Hagler, Fleet Contras, MD  Referring Physician: No referring provider defined for this encounter.  Chief complaint:   Patient with a history of asthma Heart problems with her asthma about 4 days ago  HPI:  Childhood asthma, has not had problems for many years This past weekend did have chest tightness, chest heaviness She saw primary care doctor who started on Symbicort and albuterol  She requested to see pulmonary  Has not had any problems with her asthma recently Was not on any medications recently Does not smoke  Has only used Symbicort for 1 day Still has some shortness of breath and chest tightness She may have a component of allergies  She is unsure what may have triggered her symptoms over the weekend apart from the fact that she did have some nasal congestion  No pets Stay-at-home mom No significant occupational history  Outpatient Encounter Medications as of 08/16/2020  Medication Sig  . acetaminophen (TYLENOL) 500 MG tablet Take by mouth.  Marland Kitchen albuterol (VENTOLIN HFA) 108 (90 Base) MCG/ACT inhaler   . levothyroxine (SYNTHROID) 50 MCG tablet Take 50 mcg by mouth daily.  . SYMBICORT 80-4.5 MCG/ACT inhaler Inhale into the lungs.   No facility-administered encounter medications on file as of 08/16/2020.    Allergies as of 08/16/2020  . (No Known Allergies)   No past medical history on file.  No family history on file.  Social History   Socioeconomic History  . Marital status: Unknown    Spouse name: Not on file  . Number of children: Not on file  . Years of education: Not on file  . Highest education level: Not on file  Occupational History  . Not on file  Tobacco Use  . Smoking status: Not on file  Substance and Sexual Activity  . Alcohol use: Not on file  . Drug use: Not on file  . Sexual activity: Not on file  Other Topics Concern  . Not on file  Social History Narrative   . Not on file   Social Determinants of Health   Financial Resource Strain:   . Difficulty of Paying Living Expenses: Not on file  Food Insecurity:   . Worried About Programme researcher, broadcasting/film/video in the Last Year: Not on file  . Ran Out of Food in the Last Year: Not on file  Transportation Needs:   . Lack of Transportation (Medical): Not on file  . Lack of Transportation (Non-Medical): Not on file  Physical Activity:   . Days of Exercise per Week: Not on file  . Minutes of Exercise per Session: Not on file  Stress:   . Feeling of Stress : Not on file  Social Connections:   . Frequency of Communication with Friends and Family: Not on file  . Frequency of Social Gatherings with Friends and Family: Not on file  . Attends Religious Services: Not on file  . Active Member of Clubs or Organizations: Not on file  . Attends Banker Meetings: Not on file  . Marital Status: Not on file  Intimate Partner Violence:   . Fear of Current or Ex-Partner: Not on file  . Emotionally Abused: Not on file  . Physically Abused: Not on file  . Sexually Abused: Not on file    Review of Systems  HENT: Positive for congestion. Negative for sore throat.   Respiratory: Positive for chest tightness  and shortness of breath.   Psychiatric/Behavioral: Negative for sleep disturbance.    There were no vitals filed for this visit.   Physical Exam Constitutional:      Appearance: She is obese.  HENT:     Right Ear: There is no impacted cerumen.     Mouth/Throat:     Mouth: Mucous membranes are moist.  Eyes:     General:        Right eye: No discharge.        Left eye: No discharge.  Cardiovascular:     Rate and Rhythm: Normal rate and regular rhythm.     Heart sounds: No murmur heard.  No friction rub.  Pulmonary:     Effort: No respiratory distress.     Breath sounds: No stridor. No wheezing or rhonchi.  Musculoskeletal:     Cervical back: No rigidity.  Neurological:     General: No focal  deficit present.     Mental Status: She is alert.  Psychiatric:        Mood and Affect: Mood normal.    Data Reviewed: Chest x-ray performed 08/16/2020 is within normal limits Assessment:  Asthma with exacerbation  Continue Symbicort Continue albuterol  Inhaler technique was reviewed, patient did not have any inhalers without to practice with  I did discuss possibly trying to stop Symbicort after about a month of use and to just have albuterol around as needed   Plan/Recommendations: We will obtain a pulmonary function test  Try to pay attention to triggers and avoid triggers as possible  I will see her back in about 3 months  Encouraged to call with any significant concerns   Virl Diamond MD [facility] 08/16/2020, 9:54 AM  CC: No ref. provider found

## 2021-02-03 ENCOUNTER — Emergency Department (HOSPITAL_BASED_OUTPATIENT_CLINIC_OR_DEPARTMENT_OTHER): Payer: 59

## 2021-02-03 ENCOUNTER — Encounter (HOSPITAL_BASED_OUTPATIENT_CLINIC_OR_DEPARTMENT_OTHER): Payer: Self-pay | Admitting: Obstetrics and Gynecology

## 2021-02-03 ENCOUNTER — Other Ambulatory Visit: Payer: Self-pay

## 2021-02-03 ENCOUNTER — Inpatient Hospital Stay (HOSPITAL_BASED_OUTPATIENT_CLINIC_OR_DEPARTMENT_OTHER)
Admission: EM | Admit: 2021-02-03 | Discharge: 2021-02-07 | DRG: 759 | Disposition: A | Discharge status: Home / Self Care | Payer: 59 | Attending: Obstetrics & Gynecology | Admitting: Obstetrics & Gynecology

## 2021-02-03 DIAGNOSIS — R1031 Right lower quadrant pain: Secondary | ICD-10-CM

## 2021-02-03 DIAGNOSIS — Z20822 Contact with and (suspected) exposure to covid-19: Secondary | ICD-10-CM | POA: Diagnosis not present

## 2021-02-03 DIAGNOSIS — E039 Hypothyroidism, unspecified: Secondary | ICD-10-CM | POA: Diagnosis not present

## 2021-02-03 DIAGNOSIS — N7093 Salpingitis and oophoritis, unspecified: Principal | ICD-10-CM | POA: Diagnosis present

## 2021-02-03 DIAGNOSIS — D649 Anemia, unspecified: Secondary | ICD-10-CM | POA: Diagnosis not present

## 2021-02-03 DIAGNOSIS — K37 Unspecified appendicitis: Secondary | ICD-10-CM

## 2021-02-03 DIAGNOSIS — J45909 Unspecified asthma, uncomplicated: Secondary | ICD-10-CM | POA: Diagnosis not present

## 2021-02-03 DIAGNOSIS — K651 Peritoneal abscess: Secondary | ICD-10-CM

## 2021-02-03 HISTORY — DX: Anemia, unspecified: D64.9

## 2021-02-03 HISTORY — DX: Hypothyroidism, unspecified: E03.9

## 2021-02-03 HISTORY — DX: Unspecified asthma, uncomplicated: J45.909

## 2021-02-03 LAB — URINALYSIS, ROUTINE W REFLEX MICROSCOPIC
Bilirubin Urine: NEGATIVE
Glucose, UA: NEGATIVE mg/dL
Ketones, ur: >80 mg/dL — AB
Nitrite: NEGATIVE
Protein, ur: 100 mg/dL — AB
RBC / HPF: >50 RBC/hpf — ABNORMAL HIGH (ref 0–5)
Specific Gravity, Urine: 1.032 — ABNORMAL HIGH (ref 1.005–1.030)
WBC, UA: >50 WBC/hpf — ABNORMAL HIGH (ref 0–5)
pH: 5.5 (ref 5.0–8.0)

## 2021-02-03 LAB — CBC WITH DIFFERENTIAL/PLATELET
Abs Immature Granulocytes: 0.02 10*3/uL (ref 0.00–0.07)
Basophils Absolute: 0 10*3/uL (ref 0.0–0.1)
Basophils Relative: 0 %
Eosinophils Absolute: 0 10*3/uL (ref 0.0–0.5)
Eosinophils Relative: 0 %
HCT: 36.2 % (ref 36.0–46.0)
Hemoglobin: 11.3 g/dL — ABNORMAL LOW (ref 12.0–15.0)
Immature Granulocytes: 0 %
Lymphocytes Relative: 12 %
Lymphs Abs: 1.1 10*3/uL (ref 0.7–4.0)
MCH: 23.8 pg — ABNORMAL LOW (ref 26.0–34.0)
MCHC: 31.2 g/dL (ref 30.0–36.0)
MCV: 76.4 fL — ABNORMAL LOW (ref 80.0–100.0)
Monocytes Absolute: 0.5 10*3/uL (ref 0.1–1.0)
Monocytes Relative: 5 %
Neutro Abs: 7.2 10*3/uL (ref 1.7–7.7)
Neutrophils Relative %: 83 %
Platelets: 295 10*3/uL (ref 150–400)
RBC: 4.74 MIL/uL (ref 3.87–5.11)
RDW: 17 % — ABNORMAL HIGH (ref 11.5–15.5)
WBC: 8.9 10*3/uL (ref 4.0–10.5)
nRBC: 0 % (ref 0.0–0.2)

## 2021-02-03 LAB — PREGNANCY, URINE: Preg Test, Ur: NEGATIVE

## 2021-02-03 LAB — COMPREHENSIVE METABOLIC PANEL
ALT: 11 U/L (ref 0–44)
AST: 14 U/L — ABNORMAL LOW (ref 15–41)
Albumin: 4.4 g/dL (ref 3.5–5.0)
Alkaline Phosphatase: 57 U/L (ref 38–126)
Anion gap: 12 (ref 5–15)
BUN: 10 mg/dL (ref 6–20)
CO2: 24 mmol/L (ref 22–32)
Calcium: 9.3 mg/dL (ref 8.9–10.3)
Chloride: 101 mmol/L (ref 98–111)
Creatinine, Ser: 0.77 mg/dL (ref 0.44–1.00)
GFR, Estimated: >60 mL/min (ref >60)
Glucose, Bld: 86 mg/dL (ref 70–99)
Potassium: 3.7 mmol/L (ref 3.5–5.1)
Sodium: 137 mmol/L (ref 135–145)
Total Bilirubin: 1.3 mg/dL — ABNORMAL HIGH (ref 0.3–1.2)
Total Protein: 8.5 g/dL — ABNORMAL HIGH (ref 6.5–8.1)

## 2021-02-03 LAB — LIPASE, BLOOD: Lipase: 14 U/L (ref 11–51)

## 2021-02-03 LAB — RESP PANEL BY RT-PCR (FLU A&B, COVID) ARPGX2
Influenza A by PCR: NEGATIVE
Influenza B by PCR: NEGATIVE
SARS Coronavirus 2 by RT PCR: NEGATIVE

## 2021-02-03 LAB — WET PREP, GENITAL
Clue Cells Wet Prep HPF POC: NONE SEEN
Sperm: NONE SEEN
Trich, Wet Prep: NONE SEEN

## 2021-02-03 IMAGING — CT CT ABD-PELV W/ CM
1 of 2 series · 14 of 32 positions shown, 18 images · IV contrast (APPLIED)
Comparison: None.

CLINICAL DATA: Right lower quadrant pain, appendicitis. Nausea,
emesis, decreased appetite. Last menstrual period [DATE].

EXAM:
CT ABDOMEN AND PELVIS WITH CONTRAST
TECHNIQUE: Multidetector CT imaging of the abdomen and pelvis was performed
using the standard protocol following bolus administration of
intravenous contrast.
CONTRAST:  100mL OMNIPAQUE IOHEXOL 300 MG/ML  SOLN

[Series 2: abd pel w · axial · 0.64mm/px · z∈[+1004,+1389]mm · 14 of 85 slices shown, 18 images]
[im 4/85  soft-tissue]
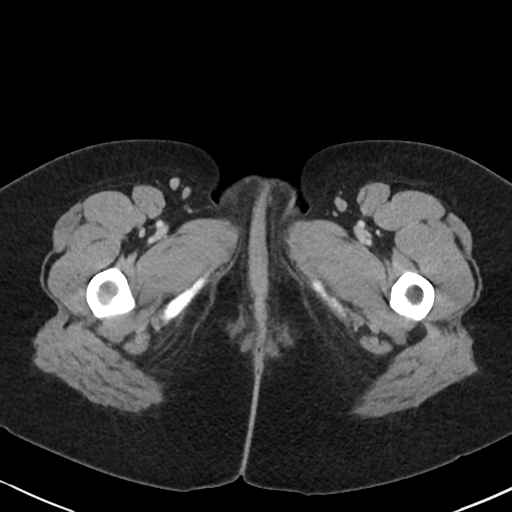
[im 4/85  bone]
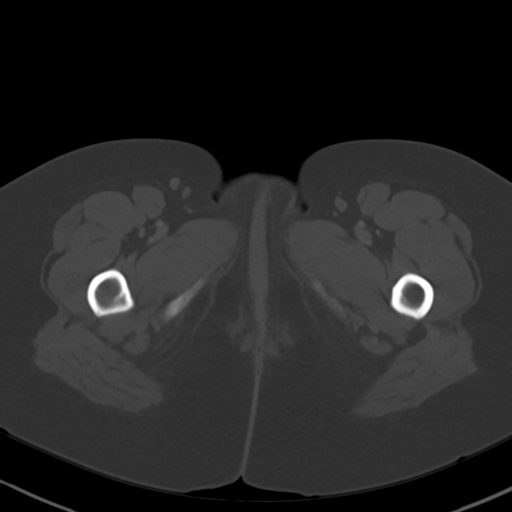
[im 11/85  soft-tissue]
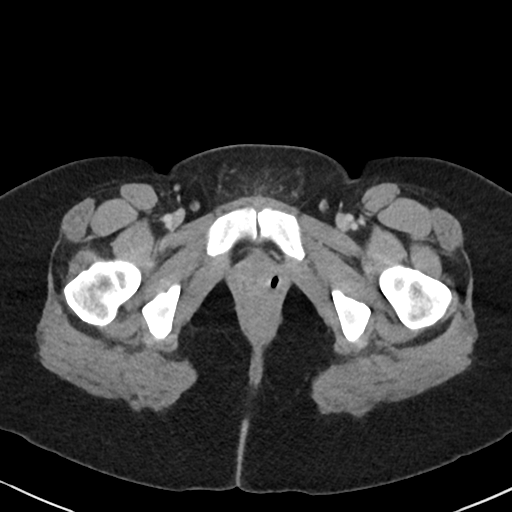
[im 18/85  soft-tissue]
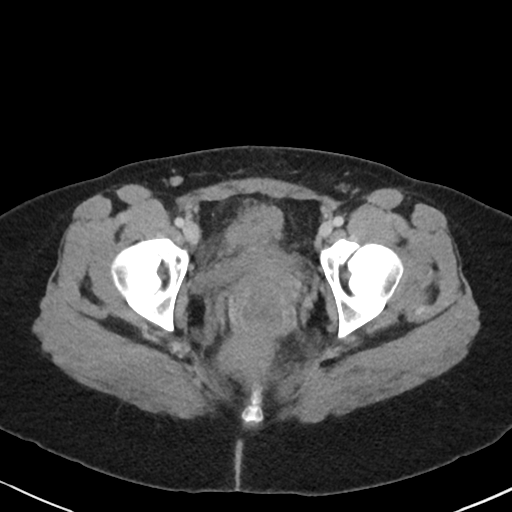
[im 25/85  soft-tissue]
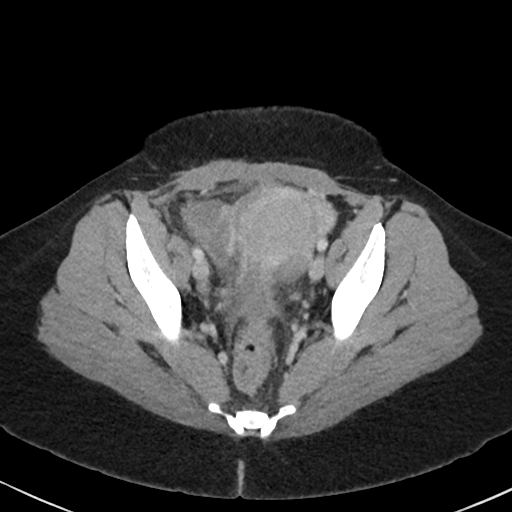
[im 32/85  soft-tissue]
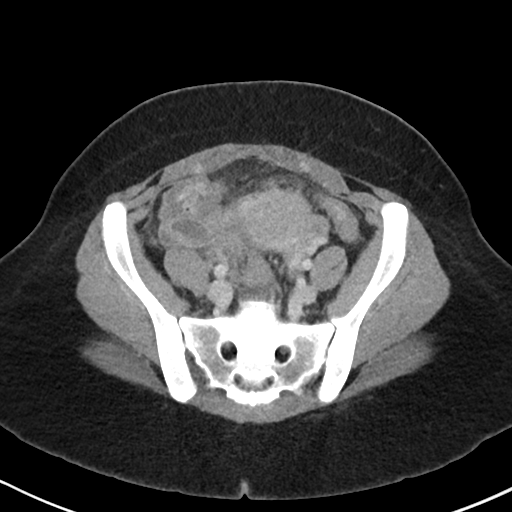
[im 39/85  soft-tissue]
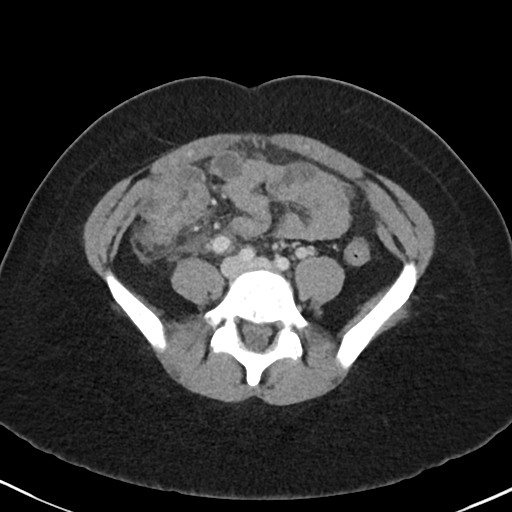
[im 46/85  soft-tissue]
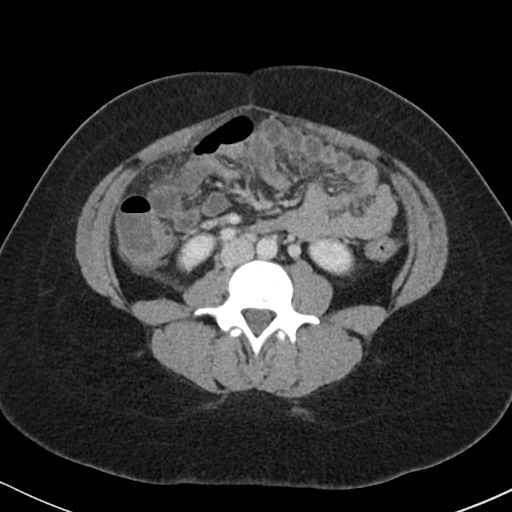
[im 53/85  soft-tissue]
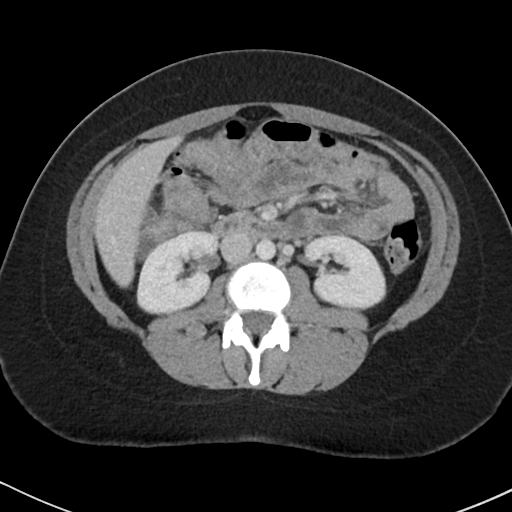
[im 60/85  soft-tissue]
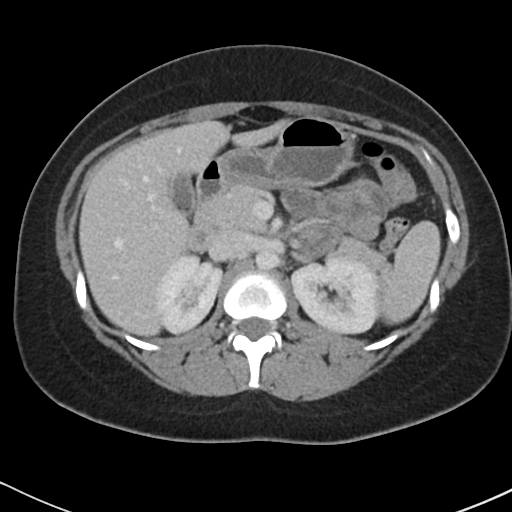
[im 60/85  bone]
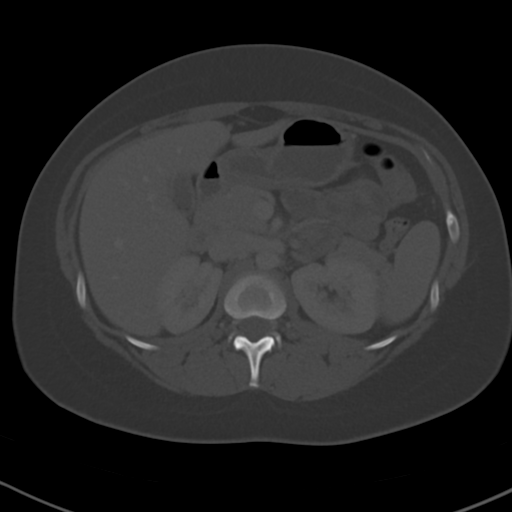
[im 67/85  soft-tissue]
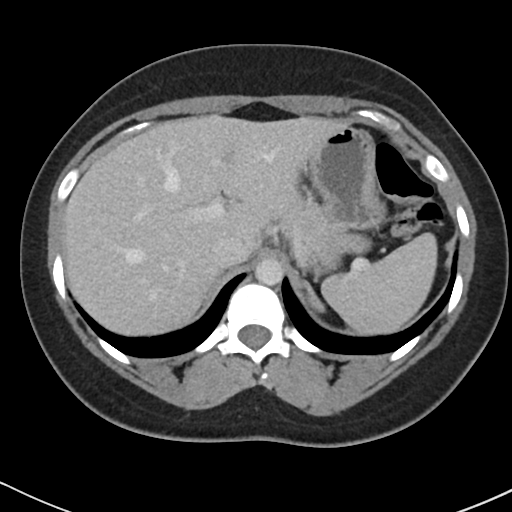
[im 71/85  lung]
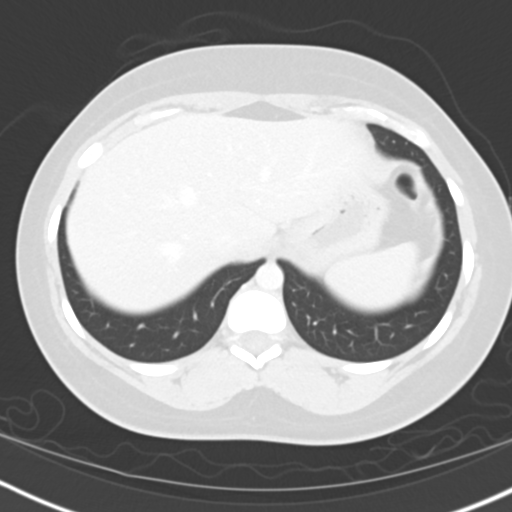
[im 74/85  soft-tissue]
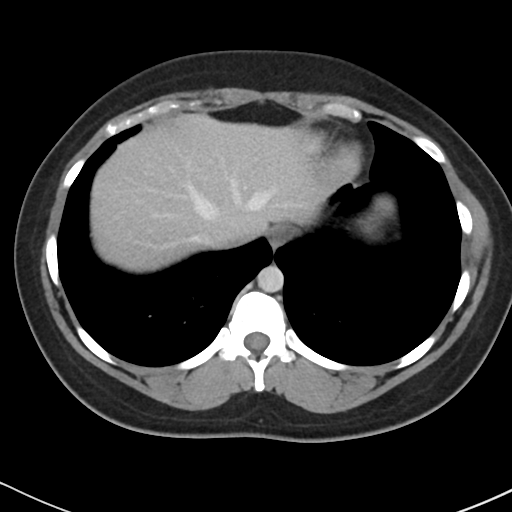
[im 74/85  lung]
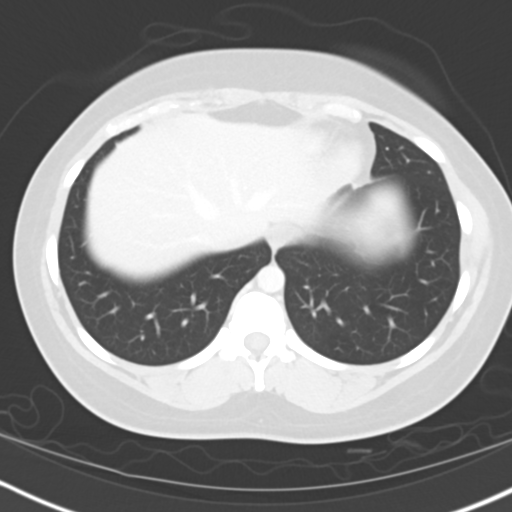
[im 78/85  lung]
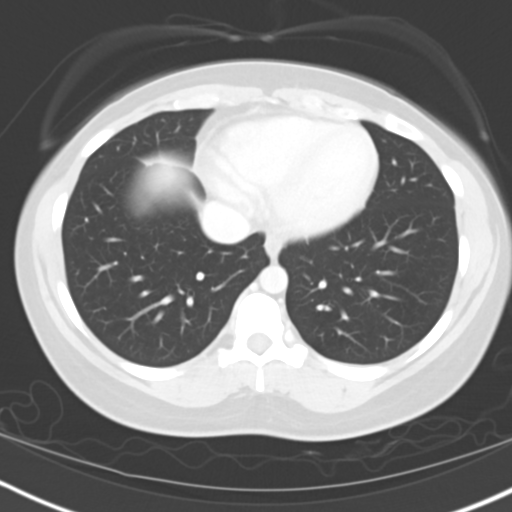
[im 81/85  soft-tissue]
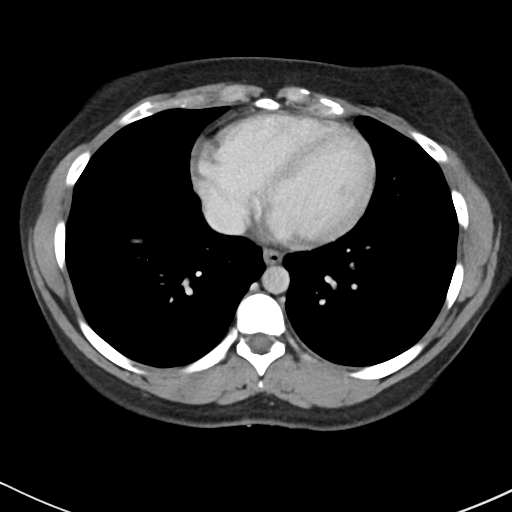
[im 81/85  lung]
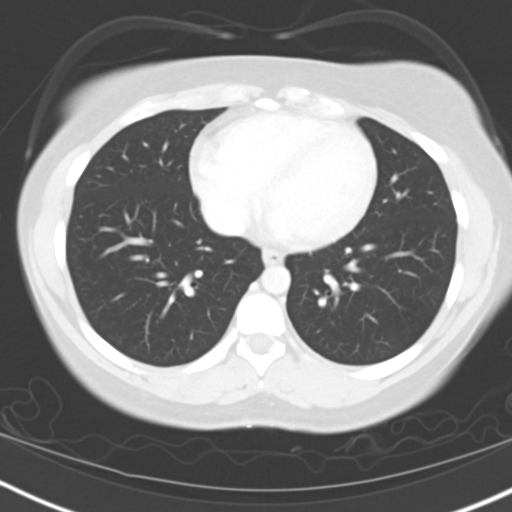

[14 of 32 positions shown; findings below may reference images not displayed]

FINDINGS: Lower chest: No acute abnormality.

Hepatobiliary: There is a 1.6 cm heterogeneous lesion within the
right hepatic lobe. No focal liver abnormality. Punctate foci of gas
within the gallbladder lumen related to gallstones. No inflammatory
changes of the gallbladder noted. No gas within the gallbladder low
wall. No gallbladder gallbladder wall thickening, or pericholecystic
fluid. No biliary dilatation.

Pancreas: No focal lesion. Normal pancreatic contour. No surrounding
inflammatory changes. No main pancreatic ductal dilatation.

Spleen: Normal in size without focal abnormality.

Adrenals/Urinary Tract: No adrenal nodule bilaterally. Bilateral
kidneys enhance symmetrically. No hydronephrosis. No hydroureter.
The urinary bladder is unremarkable.

Stomach/Bowel: Stomach is within normal limits. No evidence of small
or large bowel wall thickening or dilatation.

Appendix: Not definitely identified; however, right lower quadrant
demonstrates inflammatory changes with suggestion of a heterogeneous
complex 4.5 x 4.2 cm right lower quadrant/right adnexal lesion that
could represent a finding associated with the appendix the right
ovary.

Vascular/Lymphatic: No significant vascular findings are present. No
enlarged abdominal or pelvic lymph nodes.

Reproductive: Please see appendix section regarding right adnexa.
The left adnexa/ovary and uterus are unremarkable.

Other: Trace right lower quadrant free fluid. No intraperitoneal
free gas.

Musculoskeletal: No acute or significant osseous findings.
IMPRESSION: 1. Right lower quadrant demonstrates inflammatory changes with
suggestion of a heterogeneous complex 4.5 x 4.2 cm right lower
quadrant/right adnexal lesion. Differential diagnosis includes
perforated acute appendicitis with associated abscess formation
versus tubo-ovarian abscess.
2. Cholelithiasis with no findings of acute cholecystitis.
3. Indeterminate 1.6 cm heterogeneous lesion within the right
hepatic lobe that could represent a hepatic hemangioma. Consider MRI
liver protocol for further evaluation.

## 2021-02-03 IMAGING — US US PELVIS COMPLETE TRANSABD/TRANSVAG W DUPLEX
1 series · 13 of 25 positions shown · non-contrast
Comparison: None.

CLINICAL DATA: Right lower quadrant pain.

EXAM:
TRANSABDOMINAL AND TRANSVAGINAL ULTRASOUND OF PELVIS
DOPPLER ULTRASOUND OF OVARIES
TECHNIQUE: Both transabdominal and transvaginal ultrasound examinations of the
pelvis were performed. Transabdominal technique was performed for
global imaging of the pelvis including uterus, ovaries, adnexal
regions, and pelvic cul-de-sac.
It was necessary to proceed with endovaginal exam following the
transabdominal exam to visualize the uterus, endometrium, bilateral
ovaries and bilateral adnexa. Color and duplex Doppler ultrasound
was utilized to evaluate blood flow to the ovaries.

[Series 1: us pelvic complete w transvaginal and torsion righ · 13 of 154 slices shown]
[im 1/154]
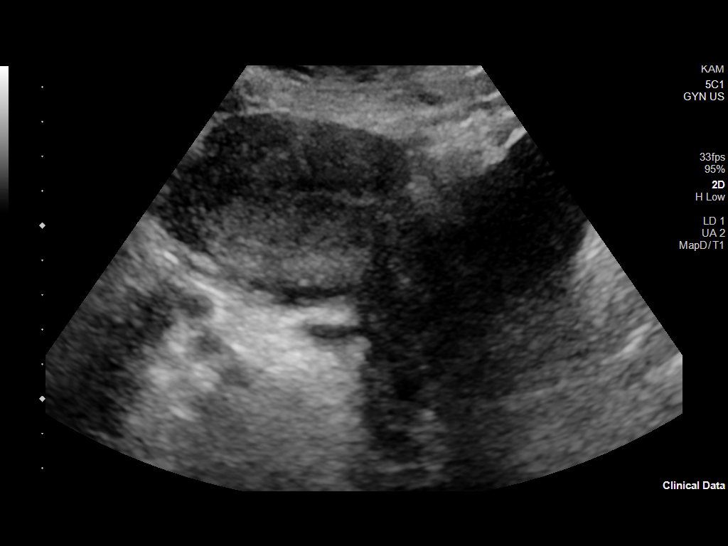
[im 13/154]
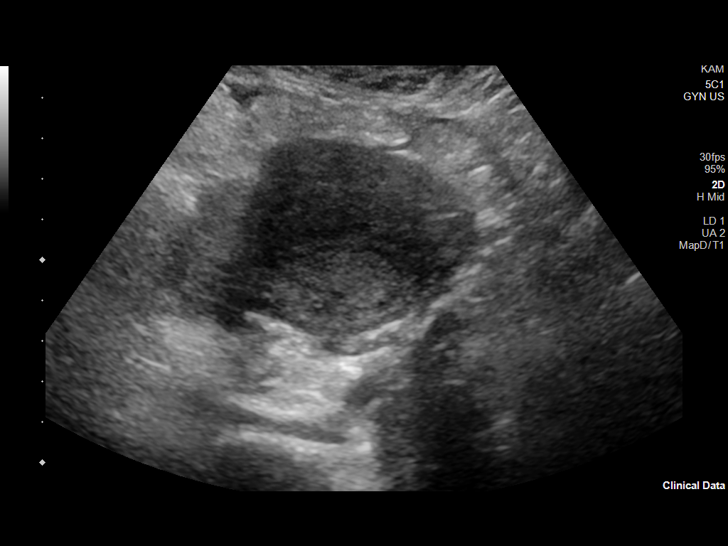
[im 26/154]
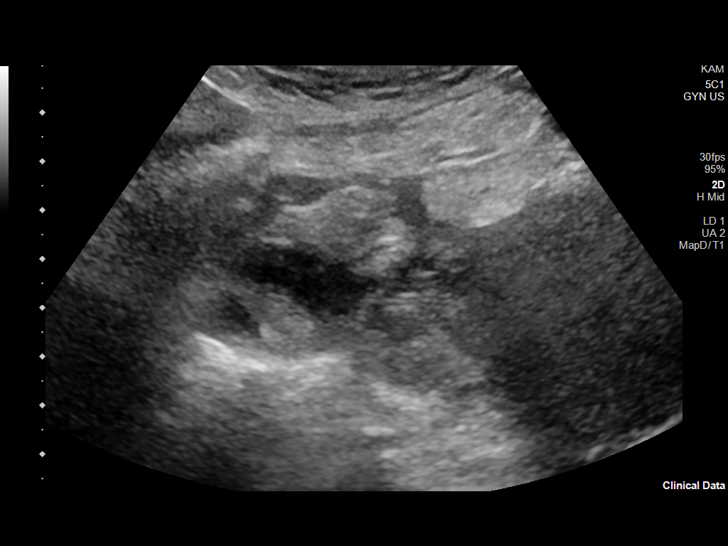
[im 39/154]
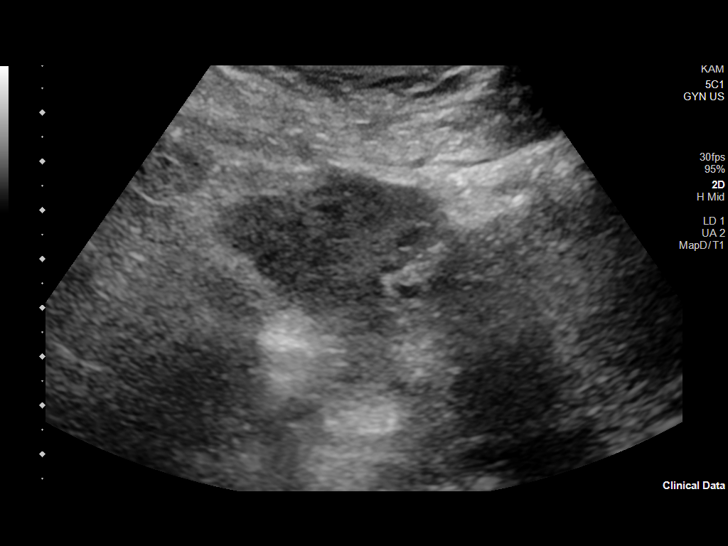
[im 52/154]
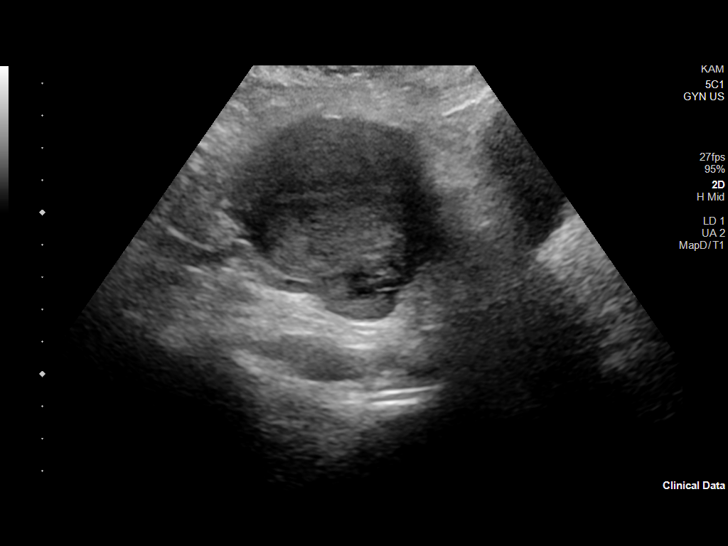
[im 64/154]
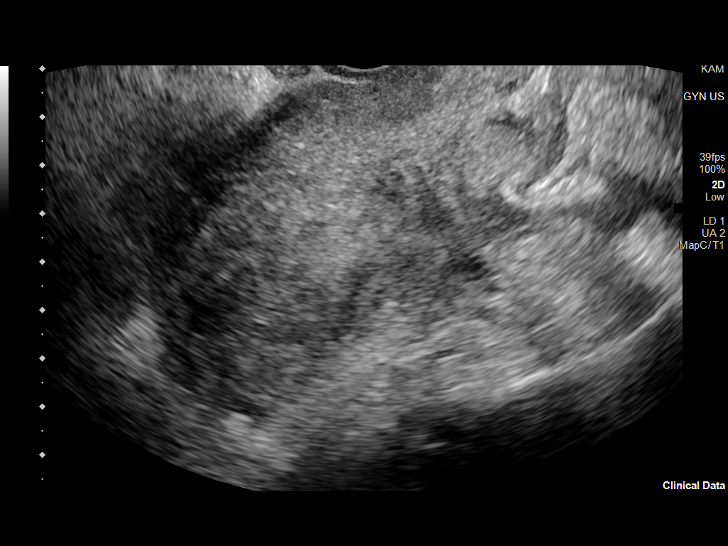
[im 77/154]
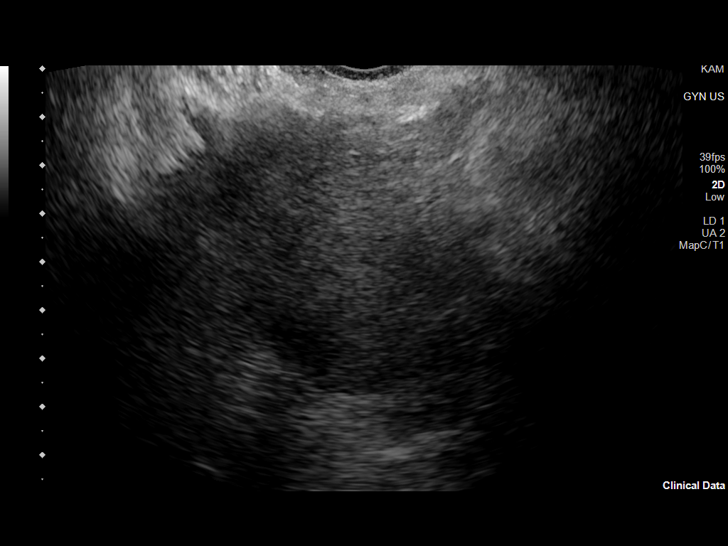
[im 90/154]
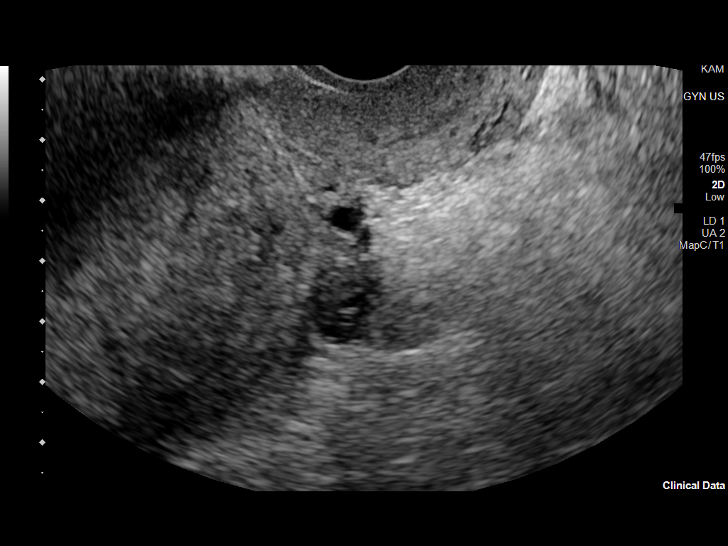
[im 103/154]
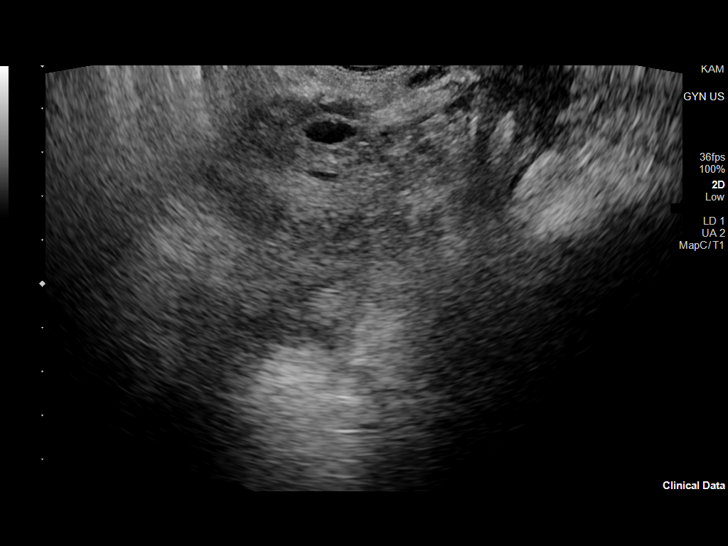
[im 115/154]
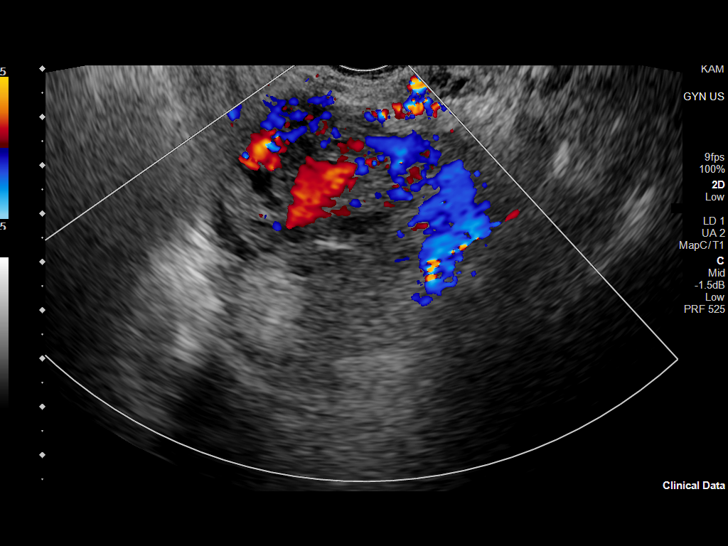
[im 128/154]
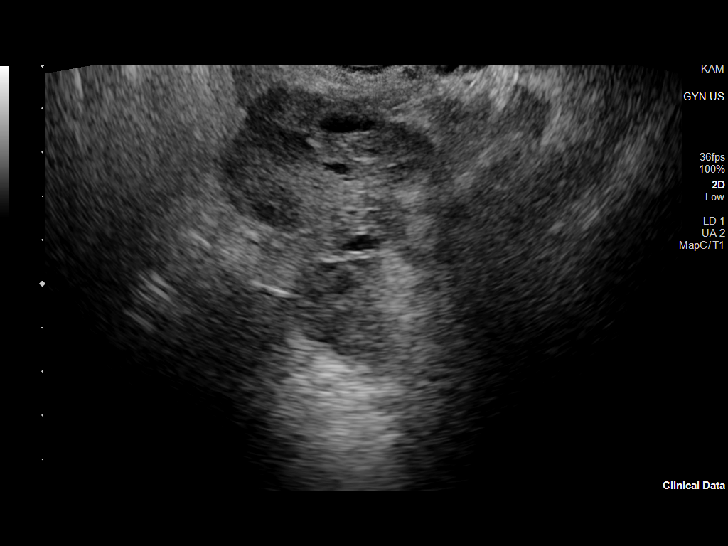
[im 141/154]
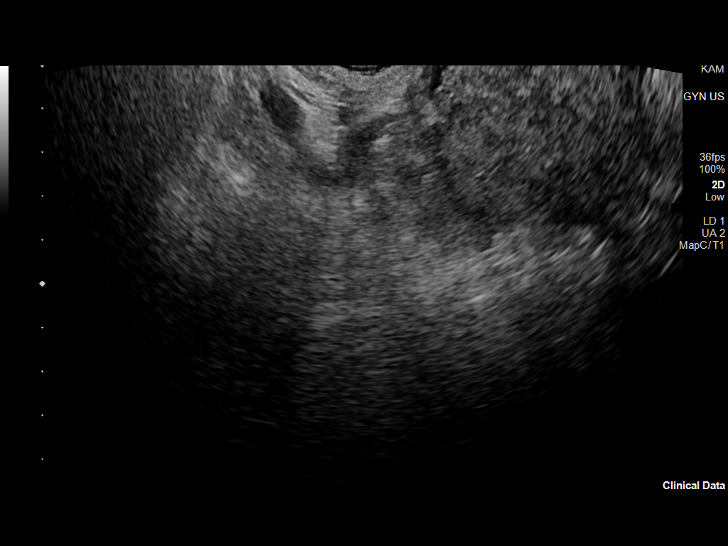
[im 154/154]
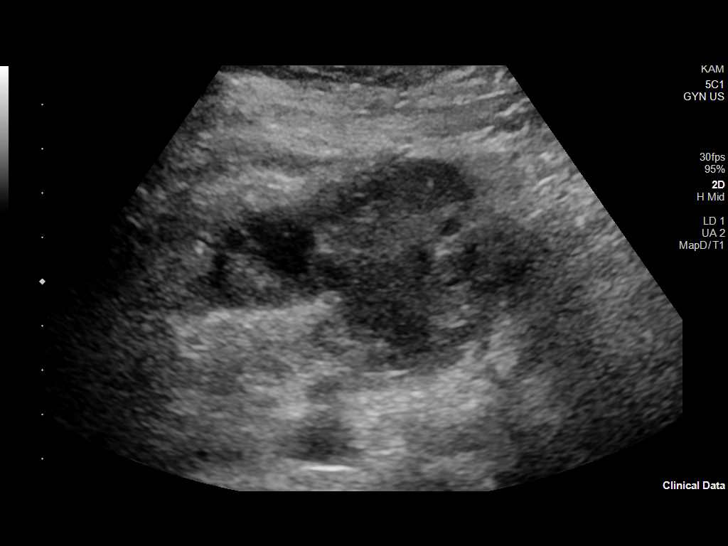

[13 of 25 positions shown; findings below may reference images not displayed]

FINDINGS: Uterus

Measurements: 10.7 cm x 5.0 cm x 6.6 cm = volume: 186 mL. No
fibroids or other mass visualized.

Endometrium

Thickness: 12.9 mm.  No focal abnormality visualized.

Right ovary

The right ovary is not clearly visualized. A 7.1 cm x 3.4 cm x
cm ill-defined area of heterogeneous hypoechogenicity is seen within
the right adnexa. Portions of this area are hyperemic on color
Doppler evaluation.

Left ovary

Measurements: 2.3 cm x 1.6 cm x 1.9 cm = volume: 3.7 mL. Normal
appearance/no adnexal mass.

Pulsed Doppler evaluation of the left ovary demonstrates normal
low-resistance arterial and venous waveforms.

Other findings

A trace amount of pelvic free fluid is seen.
IMPRESSION: Large heterogeneous, hyperemic area within the right adnexa which
corresponds to the findings seen within this region on the abdomen
pelvis CT dated [DATE]. Surgical consultation and further
evaluation with MRI is recommended, as a tubo-ovarian abscess cannot
be excluded.

## 2021-02-03 MED ORDER — IOHEXOL 300 MG/ML  SOLN
100.0000 mL | Freq: Once | INTRAMUSCULAR | Status: AC | PRN
  Administered 2021-02-03: 100 mL via INTRAVENOUS

## 2021-02-03 MED ORDER — METRONIDAZOLE IN NACL 5-0.79 MG/ML-% IV SOLN
500.0000 mg | Freq: Once | INTRAVENOUS | Status: AC
  Administered 2021-02-03: 500 mg via INTRAVENOUS
  Filled 2021-02-03: qty 100

## 2021-02-03 MED ORDER — SODIUM CHLORIDE 0.9 % IV BOLUS
1000.0000 mL | Freq: Once | INTRAVENOUS | Status: AC
  Administered 2021-02-03: 1000 mL via INTRAVENOUS

## 2021-02-03 MED ORDER — ONDANSETRON HCL 4 MG/2ML IJ SOLN
4.0000 mg | Freq: Once | INTRAMUSCULAR | Status: AC
  Administered 2021-02-03: 4 mg via INTRAVENOUS
  Filled 2021-02-03: qty 2

## 2021-02-03 MED ORDER — SODIUM CHLORIDE 0.9 % IV SOLN
2.0000 g | Freq: Once | INTRAVENOUS | Status: AC
  Administered 2021-02-03: 2 g via INTRAVENOUS
  Filled 2021-02-03: qty 20

## 2021-02-03 MED ORDER — DIPHENHYDRAMINE HCL 50 MG/ML IJ SOLN
25.0000 mg | Freq: Once | INTRAMUSCULAR | Status: AC
  Administered 2021-02-03: 25 mg via INTRAVENOUS
  Filled 2021-02-03: qty 1

## 2021-02-03 NOTE — ED Provider Notes (Signed)
[facility] EMERGENCY DEPT Provider Note   CSN: [id number] Arrival date & time: 02/03/21  1831     History Chief Complaint  Patient presents with   Abdominal Pain   Emesis    Tracy Hansen is a 30 y.o. female.  Pt presents to the ED today with abdominal pain.  She said it started yesterday.  She's had n/v.  She has not had an appetite.  No fever.  She is on her period now.  She has never had anything like this in the past.        Past Medical History:  Diagnosis Date   Anemia    Asthma    Hypothyroidism     Patient Active Problem List   Diagnosis Date Noted   Tubo-ovarian abscess, right 02/04/2021    Past Surgical History:  Procedure Laterality Date   CESAREAN SECTION     x 4     OB History    Gravida  4   Para  4   Term  3   Preterm  1   AB      Living  4     SAB      IAB      Ectopic      Multiple      Live Births           Obstetric Comments  C section x 4, with BTL with last section 01/2020        History reviewed. No pertinent family history.  Social History   Tobacco Use   Smoking status: Never Smoker   Smokeless tobacco: Never Used  Vaping Use   Vaping Use: Never used  Substance Use Topics   Alcohol use: Yes    Comment: Social   Drug use: Never    Home Medications Prior to Admission medications   Medication Sig Start Date End Date Taking? Authorizing Provider  levothyroxine (SYNTHROID) 50 MCG tablet Take 50 mcg by mouth daily. 05/22/20  Yes [provider]  SYMBICORT 80-4.5 MCG/ACT inhaler Inhale into the lungs. 08/15/20  Yes [provider]  acetaminophen (TYLENOL) 500 MG tablet Take by mouth.    [provider]  albuterol (VENTOLIN HFA) 108 (90 Base) MCG/ACT inhaler  08/15/20   [provider]  Loratadine 10 MG CAPS Take by mouth.    [provider]    Allergies    Suprax [cefixime]  Review of Systems   Review of Systems  Gastrointestinal:  Positive for abdominal pain, nausea and vomiting.  All other systems reviewed and are negative.   Physical Exam Updated Vital Signs BP 108/65 (BP Location: Left Arm)    Pulse (!) 104    Temp 98.3 F (36.8 C) (Oral)    Resp 17    Ht 5\' 2"  (1.575 m)    Wt 79.1 kg    LMP 02/02/2021    SpO2 97%    Breastfeeding No    BMI 31.90 kg/m   Physical Exam Vitals and nursing note reviewed. Exam conducted with a chaperone present.  Constitutional:      Appearance: She is well-developed.  HENT:     Head: Normocephalic and atraumatic.     Mouth/Throat:     Mouth: Mucous membranes are dry.     Pharynx: Oropharynx is clear.  Eyes:     Extraocular Movements: Extraocular movements intact.     Pupils: Pupils are equal, round, and reactive to light.  Cardiovascular:     Rate and Rhythm:  Regular rhythm. Tachycardia present.     Heart sounds: Normal heart sounds.  Pulmonary:     Effort: Pulmonary effort is normal.     Breath sounds: Normal breath sounds.  Abdominal:     General: Abdomen is flat.     Palpations: Abdomen is soft.     Tenderness: There is abdominal tenderness in the right lower quadrant.  Genitourinary:    Exam position: Lithotomy position.     Vagina: Bleeding present.     Cervix: Cervical bleeding present.     Uterus: Normal.      Adnexa: Right adnexa normal and left adnexa normal.  Skin:    General: Skin is warm.     Capillary Refill: Capillary refill takes less than 2 seconds.  Neurological:     General: No focal deficit present.     Mental Status: She is alert and oriented to person, place, and time.  Psychiatric:        Mood and Affect: Mood normal.        Behavior: Behavior normal.     ED Results / Procedures / Treatments   Labs (all labs ordered are listed, but only abnormal results are displayed) Labs Reviewed  WET PREP, GENITAL - Abnormal; Notable for the following components:      Result Value   Yeast Wet Prep HPF POC PRESENT (*)    WBC, Wet Prep HPF POC MANY  (*)    All other components within normal limits  CBC WITH DIFFERENTIAL/PLATELET - Abnormal; Notable for the following components:   Hemoglobin 11.3 (*)    MCV 76.4 (*)    MCH 23.8 (*)    RDW 17.0 (*)    All other components within normal limits  COMPREHENSIVE METABOLIC PANEL - Abnormal; Notable for the following components:   Total Protein 8.5 (*)    AST 14 (*)    Total Bilirubin 1.3 (*)    All other components within normal limits  URINALYSIS, ROUTINE W REFLEX MICROSCOPIC - Abnormal; Notable for the following components:   Color, Urine BROWN (*)    APPearance TURBID (*)    Specific Gravity, Urine 1.032 (*)    Hgb urine dipstick LARGE (*)    Ketones, ur >80 (*)    Protein, ur 100 (*)    Leukocytes,Ua SMALL (*)    RBC / HPF >50 (*)    WBC, UA >50 (*)    Non Squamous Epithelial 0-5 (*)    Crystals PRESENT (*)    All other components within normal limits  CBC WITH DIFFERENTIAL/PLATELET - Abnormal; Notable for the following components:   Hemoglobin 10.0 (*)    HCT 30.6 (*)    MCV 76.7 (*)    MCH 25.1 (*)    RDW 16.9 (*)    All other components within normal limits  RESP PANEL BY RT-PCR (FLU A&B, COVID) ARPGX2  LIPASE, BLOOD  PREGNANCY, URINE  RAPID HIV SCREEN (HIV 1/2 AB+AG)  GC/CHLAMYDIA PROBE AMP ([place]) NOT AT [facility]    EKG None  Radiology CT ABDOMEN PELVIS W CONTRAST  Result Date: 02/03/2021 CLINICAL DATA:  Right lower quadrant pain, appendicitis. Nausea, emesis, decreased appetite. Last menstrual period 02/02/2021. EXAM: CT ABDOMEN AND PELVIS WITH CONTRAST TECHNIQUE: Multidetector CT imaging of the abdomen and pelvis was performed using the standard protocol following bolus administration of intravenous contrast. CONTRAST:  100mL OMNIPAQUE IOHEXOL 300 MG/ML  SOLN COMPARISON:  None. FINDINGS: Lower chest: No acute abnormality. Hepatobiliary: There is a 1.6 cm heterogeneous  lesion within the right hepatic lobe. No focal liver abnormality. Punctate foci of gas within  the gallbladder lumen related to gallstones. No inflammatory changes of the gallbladder noted. No gas within the gallbladder low wall. No gallbladder gallbladder wall thickening, or pericholecystic fluid. No biliary dilatation. Pancreas: No focal lesion. Normal pancreatic contour. No surrounding inflammatory changes. No main pancreatic ductal dilatation. Spleen: Normal in size without focal abnormality. Adrenals/Urinary Tract: No adrenal nodule bilaterally. Bilateral kidneys enhance symmetrically. No hydronephrosis. No hydroureter. The urinary bladder is unremarkable. Stomach/Bowel: Stomach is within normal limits. No evidence of small or large bowel wall thickening or dilatation. Appendix: Not definitely identified; however, right lower quadrant demonstrates inflammatory changes with suggestion of a heterogeneous complex 4.5 x 4.2 cm right lower quadrant/right adnexal lesion that could represent a finding associated with the appendix the right ovary. Vascular/Lymphatic: No significant vascular findings are present. No enlarged abdominal or pelvic lymph nodes. Reproductive: Please see appendix section regarding right adnexa. The left adnexa/ovary and uterus are unremarkable. Other: Trace right lower quadrant free fluid. No intraperitoneal free gas. Musculoskeletal: No acute or significant osseous findings. IMPRESSION: 1. Right lower quadrant demonstrates inflammatory changes with suggestion of a heterogeneous complex 4.5 x 4.2 cm right lower quadrant/right adnexal lesion. Differential diagnosis includes perforated acute appendicitis with associated abscess formation versus tubo-ovarian abscess. 2. Cholelithiasis with no findings of acute cholecystitis. 3. Indeterminate 1.6 cm heterogeneous lesion within the right hepatic lobe that could represent a hepatic hemangioma. Consider MRI liver protocol for further evaluation. Electronically Signed   By: Tish Frederickson M.D.   On: 02/03/2021 20:27   US PELVIC COMPLETE W  TRANSVAGINAL AND TORSION R/O  Result Date: 02/03/2021 CLINICAL DATA:  Right lower quadrant pain. EXAM: TRANSABDOMINAL AND TRANSVAGINAL ULTRASOUND OF PELVIS DOPPLER ULTRASOUND OF OVARIES TECHNIQUE: Both transabdominal and transvaginal ultrasound examinations of the pelvis were performed. Transabdominal technique was performed for global imaging of the pelvis including uterus, ovaries, adnexal regions, and pelvic cul-de-sac. It was necessary to proceed with endovaginal exam following the transabdominal exam to visualize the uterus, endometrium, bilateral ovaries and bilateral adnexa. Color and duplex Doppler ultrasound was utilized to evaluate blood flow to the ovaries. COMPARISON:  None. FINDINGS: Uterus Measurements: 10.7 cm x 5.0 cm x 6.6 cm = volume: 186 mL. No fibroids or other mass visualized. Endometrium Thickness: 12.9 mm.  No focal abnormality visualized. Right ovary The right ovary is not clearly visualized. A 7.1 cm x 3.4 cm x 5.4 cm ill-defined area of heterogeneous hypoechogenicity is seen within the right adnexa. Portions of this area are hyperemic on color Doppler evaluation. Left ovary Measurements: 2.3 cm x 1.6 cm x 1.9 cm = volume: 3.7 mL. Normal appearance/no adnexal mass. Pulsed Doppler evaluation of the left ovary demonstrates normal low-resistance arterial and venous waveforms. Other findings A trace amount of pelvic free fluid is seen. IMPRESSION: Large heterogeneous, hyperemic area within the right adnexa which corresponds to the findings seen within this region on the abdomen pelvis CT dated February 03, 2021. Surgical consultation and further evaluation with MRI is recommended, as a tubo-ovarian abscess cannot be excluded. Electronically Signed   By: Aram Candela M.D.   On: 02/03/2021 23:01    Procedures Procedures   Medications Ordered in ED Medications  levothyroxine (SYNTHROID) tablet 50 mcg (50 mcg Oral Given 02/04/21 0621)  albuterol (VENTOLIN HFA) 108 (90 Base) MCG/ACT  inhaler 1-2 puff (1 puff Inhalation Not Given 02/04/21 1433)  loratadine (CLARITIN) tablet 10 mg (10 mg Oral Not Given 02/04/21  1009)  mometasone-formoterol (DULERA) 100-5 MCG/ACT inhaler 2 puff (2 puffs Inhalation Not Given 02/04/21 0754)  prenatal multivitamin tablet 1 tablet (1 tablet Oral Given 02/04/21 1008)  enoxaparin (LOVENOX) injection 40 mg (40 mg Subcutaneous Given 02/04/21 0622)  dextrose 5 % and 0.45 % NaCl with KCl 20 mEq/L infusion ( Intravenous Restarted 02/04/21 0745)  ibuprofen (ADVIL) tablet 600 mg (600 mg Oral Given 02/04/21 1008)  oxyCODONE-acetaminophen (PERCOCET/ROXICET) 5-325 MG per tablet 1-2 tablet (has no administration in time range)  fentaNYL (SUBLIMAZE) injection 50-100 mcg (has no administration in time range)  docusate sodium (COLACE) capsule 100 mg (100 mg Oral Given 02/04/21 1008)  bisacodyl (DULCOLAX) suppository 10 mg (has no administration in time range)  senna-docusate (Senokot-S) tablet 1 tablet (has no administration in time range)  ondansetron (ZOFRAN) tablet 8 mg (has no administration in time range)    Or  ondansetron (ZOFRAN) 8 mg in sodium chloride 0.9 % 50 mL IVPB (has no administration in time range)  cefoTEtan (CEFOTAN) 2 g in sodium chloride 0.9 % 100 mL IVPB (0 g Intravenous Stopped 02/04/21 1043)  doxycycline (VIBRAMYCIN) 100 mg in sodium chloride 0.9 % 250 mL IVPB (0 mg Intravenous Stopped 02/04/21 0945)  feeding supplement (ENSURE ENLIVE / ENSURE PLUS) liquid 237 mL (237 mLs Oral Not Given 02/04/21 1440)  sodium chloride 0.9 % bolus 1,000 mL (0 mLs Intravenous Stopped 02/03/21 2022)  ondansetron (ZOFRAN) injection 4 mg (4 mg Intravenous Given 02/03/21 1902)  iohexol (OMNIPAQUE) 300 MG/ML solution 100 mL (100 mLs Intravenous Contrast Given 02/03/21 2053)  sodium chloride 0.9 % bolus 1,000 mL (0 mLs Intravenous Stopped 02/03/21 2346)  cefTRIAXone (ROCEPHIN) 2 g in sodium chloride 0.9 % 100 mL IVPB (0 g Intravenous Stopped 02/03/21 2315)    And  metroNIDAZOLE  (FLAGYL) IVPB 500 mg (0 mg Intravenous Stopped 02/04/21 0026)  diphenhydrAMINE (BENADRYL) injection 25 mg (25 mg Intravenous Given 02/03/21 2232)    ED Course  I have reviewed the triage vital signs and the nursing notes.  Pertinent labs & imaging results that were available during my care of the patient were reviewed by me and considered in my medical decision making (see chart for details).    MDM Rules/Calculators/A&P                          Blood in urine is because pt is on her period.  CT abd not able to distinguish between appy and toa, so I ordered a pelvic US.  Pt has not had a cephalosporin since she was a small child.  She said her mom told her she had a rash.  She is willing to try rocephin a try in the ED.  She has had no reaction to the rocephin.   Pelvic US pending at shift change.  Pt signed out to Dr. Eudelia Bunch.  Final Clinical Impression(s) / ED Diagnoses Final diagnoses:  Intra-abdominal abscess [facility])    Rx / DC Orders ED Discharge Orders    None       Jacalyn Lefevre, MD 02/04/21 1504

## 2021-02-03 NOTE — ED Notes (Signed)
Pt is aware that we need a urine sample and will call us when she is ready to provide one.

## 2021-02-03 NOTE — ED Triage Notes (Signed)
Patient reports RLQ pain. Patient endorses nausea, emesis, and decreased appetite. Patient reports LMP 02/02/21. Denies chest pain. Patient denies sick exposures.

## 2021-02-03 NOTE — ED Provider Notes (Signed)
I assumed care of this patient.  Please see previous provider note for further details of Hx, PE.  Briefly patient is a 30 y.o. female who presented with lower abdominal pain.  CT scan was concerning for either ruptured appendicitis with abscess formation versus tubo-ovarian abscess.  Currently pending ultrasound to better characterize etiology.  Patient already getting antibiotics.  Plan to follow-up ultrasound and determine dispo for patient.   Ultrasound unequivocal and cannot determine whether this is a tubo-ovarian abscess or not.  I will plan to transfer the patient to [facility] and have both OB/GYN and general surgery evaluate the patient to determine the best next approach.  11:25 PM I spoke with Dr. Despina Hidden from OB/GYN who will evaluate the patient at [facility].  He request that we reach out to him once the patient arrives.  11:32 PM I spoke with Dr. Andrey Campanile who will evaluate as well.  11:36 PM Spoke with Dr. Bebe Shaggy in [facility] who will anticipate her arrival.   11:42 PM sent message to [facility] staff and included above surgeons.  11:48 PM Called back from Dr. Andrey Campanile who looked at imaging and labs. He feels this is likely gyn related, but will consult on patient in the morning. He does not plan on surgery at this time. If OB/Gyn does not admit, he requested medicine admission for IR drainage with continued Abx treatment.      Nira Conn, MD 02/03/21 2358

## 2021-02-04 ENCOUNTER — Other Ambulatory Visit: Payer: Self-pay

## 2021-02-04 ENCOUNTER — Encounter (HOSPITAL_COMMUNITY): Payer: Self-pay

## 2021-02-04 DIAGNOSIS — J45909 Unspecified asthma, uncomplicated: Secondary | ICD-10-CM | POA: Diagnosis present

## 2021-02-04 DIAGNOSIS — D649 Anemia, unspecified: Secondary | ICD-10-CM | POA: Diagnosis present

## 2021-02-04 DIAGNOSIS — E039 Hypothyroidism, unspecified: Secondary | ICD-10-CM | POA: Diagnosis present

## 2021-02-04 DIAGNOSIS — N7003 Acute salpingitis and oophoritis: Secondary | ICD-10-CM | POA: Diagnosis not present

## 2021-02-04 DIAGNOSIS — N7093 Salpingitis and oophoritis, unspecified: Secondary | ICD-10-CM | POA: Diagnosis present

## 2021-02-04 DIAGNOSIS — Z20822 Contact with and (suspected) exposure to covid-19: Secondary | ICD-10-CM | POA: Diagnosis present

## 2021-02-04 LAB — CBC WITH DIFFERENTIAL/PLATELET
Abs Immature Granulocytes: 0.02 10*3/uL (ref 0.00–0.07)
Basophils Absolute: 0 10*3/uL (ref 0.0–0.1)
Basophils Relative: 0 %
Eosinophils Absolute: 0 10*3/uL (ref 0.0–0.5)
Eosinophils Relative: 0 %
HCT: 30.6 % — ABNORMAL LOW (ref 36.0–46.0)
Hemoglobin: 10 g/dL — ABNORMAL LOW (ref 12.0–15.0)
Immature Granulocytes: 0 %
Lymphocytes Relative: 15 %
Lymphs Abs: 1.1 10*3/uL (ref 0.7–4.0)
MCH: 25.1 pg — ABNORMAL LOW (ref 26.0–34.0)
MCHC: 32.7 g/dL (ref 30.0–36.0)
MCV: 76.7 fL — ABNORMAL LOW (ref 80.0–100.0)
Monocytes Absolute: 0.5 10*3/uL (ref 0.1–1.0)
Monocytes Relative: 6 %
Neutro Abs: 6 10*3/uL (ref 1.7–7.7)
Neutrophils Relative %: 79 %
Platelets: 263 10*3/uL (ref 150–400)
RBC: 3.99 MIL/uL (ref 3.87–5.11)
RDW: 16.9 % — ABNORMAL HIGH (ref 11.5–15.5)
WBC: 7.7 10*3/uL (ref 4.0–10.5)
nRBC: 0 % (ref 0.0–0.2)

## 2021-02-04 LAB — RAPID HIV SCREEN (HIV 1/2 AB+AG)
HIV 1/2 Antibodies: NONREACTIVE
HIV-1 P24 Antigen - HIV24: NONREACTIVE

## 2021-02-04 MED ORDER — LORATADINE 10 MG PO TABS
10.0000 mg | ORAL_TABLET | Freq: Every day | ORAL | Status: DC
  Filled 2021-02-04 (×3): qty 1

## 2021-02-04 MED ORDER — FENTANYL CITRATE (PF) 100 MCG/2ML IJ SOLN: 50.0000 ug | INTRAMUSCULAR | Status: DC | PRN

## 2021-02-04 MED ORDER — SENNOSIDES-DOCUSATE SODIUM 8.6-50 MG PO TABS: 1.0000 | ORAL_TABLET | Freq: Every evening | ORAL | Status: DC | PRN

## 2021-02-04 MED ORDER — LEVOTHYROXINE SODIUM 25 MCG PO TABS
50.0000 ug | ORAL_TABLET | Freq: Every day | ORAL | Status: DC
  Administered 2021-02-04 – 2021-02-07 (×4): 50 ug via ORAL
  Filled 2021-02-04 (×4): qty 2

## 2021-02-04 MED ORDER — DOCUSATE SODIUM 100 MG PO CAPS
100.0000 mg | ORAL_CAPSULE | Freq: Two times a day (BID) | ORAL | Status: DC
  Administered 2021-02-04 (×2): 100 mg via ORAL
  Filled 2021-02-04 (×5): qty 1

## 2021-02-04 MED ORDER — ENOXAPARIN SODIUM 40 MG/0.4ML ~~LOC~~ SOLN
40.0000 mg | SUBCUTANEOUS | Status: DC
  Administered 2021-02-04 – 2021-02-07 (×4): 40 mg via SUBCUTANEOUS
  Filled 2021-02-04 (×4): qty 0.4

## 2021-02-04 MED ORDER — PRENATAL MULTIVITAMIN CH
1.0000 | ORAL_TABLET | Freq: Every day | ORAL | Status: DC
  Administered 2021-02-04: 1 via ORAL
  Filled 2021-02-04 (×3): qty 1

## 2021-02-04 MED ORDER — ALBUTEROL SULFATE HFA 108 (90 BASE) MCG/ACT IN AERS
1.0000 | INHALATION_SPRAY | Freq: Four times a day (QID) | RESPIRATORY_TRACT | Status: DC
  Filled 2021-02-04: qty 6.7

## 2021-02-04 MED ORDER — MOMETASONE FURO-FORMOTEROL FUM 100-5 MCG/ACT IN AERO
2.0000 | INHALATION_SPRAY | Freq: Two times a day (BID) | RESPIRATORY_TRACT | Status: DC
  Filled 2021-02-04: qty 8.8

## 2021-02-04 MED ORDER — BISACODYL 10 MG RE SUPP: 10.0000 mg | Freq: Every day | RECTAL | Status: DC | PRN

## 2021-02-04 MED ORDER — SODIUM CHLORIDE 0.9 % IV SOLN
8.0000 mg | Freq: Four times a day (QID) | INTRAVENOUS | Status: DC | PRN
  Filled 2021-02-04: qty 4

## 2021-02-04 MED ORDER — SODIUM CHLORIDE 0.9 % IV SOLN
100.0000 mg | Freq: Two times a day (BID) | INTRAVENOUS | Status: DC
  Administered 2021-02-04 – 2021-02-07 (×6): 100 mg via INTRAVENOUS
  Filled 2021-02-04 (×8): qty 100

## 2021-02-04 MED ORDER — IBUPROFEN 600 MG PO TABS
600.0000 mg | ORAL_TABLET | Freq: Four times a day (QID) | ORAL | Status: DC | PRN
  Administered 2021-02-04 (×2): 600 mg via ORAL
  Filled 2021-02-04 (×2): qty 1

## 2021-02-04 MED ORDER — KCL IN DEXTROSE-NACL 20-5-0.45 MEQ/L-%-% IV SOLN
INTRAVENOUS | Status: DC
  Filled 2021-02-04 (×10): qty 1000

## 2021-02-04 MED ORDER — OXYCODONE-ACETAMINOPHEN 5-325 MG PO TABS: 1.0000 | ORAL_TABLET | ORAL | Status: DC | PRN

## 2021-02-04 MED ORDER — ENSURE ENLIVE PO LIQD
237.0000 mL | Freq: Two times a day (BID) | ORAL | Status: DC
  Filled 2021-02-04 (×8): qty 237

## 2021-02-04 MED ORDER — SODIUM CHLORIDE 0.9 % IV SOLN
2.0000 g | Freq: Two times a day (BID) | INTRAVENOUS | Status: DC
  Administered 2021-02-04 – 2021-02-07 (×6): 2 g via INTRAVENOUS
  Filled 2021-02-04 (×8): qty 2

## 2021-02-04 MED ORDER — ONDANSETRON HCL 4 MG PO TABS: 8.0000 mg | ORAL_TABLET | Freq: Four times a day (QID) | ORAL | Status: DC | PRN

## 2021-02-04 NOTE — ED Provider Notes (Signed)
I assumed care of patient after transfer from outside facility.  I have spoken to Dr. Andrey Campanile with general surgery, and Dr. Despina Hidden  with OB/GYN.  They have both reviewed imaging.  At this point, Dr. Andrey Campanile does not feel this is appendicitis.  Dr. Durenda Hurt will admit for presumed tubo-ovarian abscess.  Orders have been placed.  Patient has already received antibiotics.  Patient is resting comfortably at this time   Zadie Rhine, MD 02/04/21 [phone]

## 2021-02-04 NOTE — ED Triage Notes (Signed)
Per [facility], pt coming from [facility] for ruptured appendix and ovarian cyst. Pt c/o abd pain x1 day. Received 2g rocephin, 1 g flagyl, and 2 liters NS. 20g left AC.

## 2021-02-04 NOTE — Consult Note (Signed)
Reason for Consult: abdominal pain Referring Physician: Zadie Rhine, M.D.  Tracy Hansen is an 30 y.o. female.  HPI: 30 yo female with new right lower quadrant abdominal pain. Pain started 2 days ago and became intense and constant. It is worse with movement. She has nausea and vomiting. She has loss of appetite. She denies vaginal discharge.  Past Medical History:  Diagnosis Date  . Anemia   . Asthma   . Hypothyroidism     History reviewed. No pertinent surgical history.  History reviewed. No pertinent family history.  Social History:  reports that she has never smoked. She has never used smokeless tobacco. She reports current alcohol use. She reports that she does not use drugs.  Allergies:  Allergies  Allergen Reactions  . Suprax [Cefixime]     Rash    Medications: I have reviewed the patient's current medications.  Results for orders placed or performed during the hospital encounter of 02/03/21 (from the past 48 hour(s))  CBC with Differential     Status: Abnormal   Collection Time: 02/03/21  6:37 PM  Result Value Ref Range   WBC 8.9 4.0 - 10.5 K/uL   RBC 4.74 3.87 - 5.11 MIL/uL   Hemoglobin 11.3 (L) 12.0 - 15.0 g/dL   HCT 11.9 41.7 - 40.8 %   MCV 76.4 (L) 80.0 - 100.0 fL   MCH 23.8 (L) 26.0 - 34.0 pg   MCHC 31.2 30.0 - 36.0 g/dL   RDW 14.4 (H) 81.8 - 56.3 %   Platelets 295 150 - 400 K/uL   nRBC 0.0 0.0 - 0.2 %   Neutrophils Relative % 83 %   Neutro Abs 7.2 1.7 - 7.7 K/uL   Lymphocytes Relative 12 %   Lymphs Abs 1.1 0.7 - 4.0 K/uL   Monocytes Relative 5 %   Monocytes Absolute 0.5 0.1 - 1.0 K/uL   Eosinophils Relative 0 %   Eosinophils Absolute 0.0 0.0 - 0.5 K/uL   Basophils Relative 0 %   Basophils Absolute 0.0 0.0 - 0.1 K/uL   Immature Granulocytes 0 %   Abs Immature Granulocytes 0.02 0.00 - 0.07 K/uL  Comprehensive metabolic panel     Status: Abnormal   Collection Time: 02/03/21  6:37 PM  Result Value Ref Range   Sodium 137 135 - 145 mmol/L    Potassium 3.7 3.5 - 5.1 mmol/L   Chloride 101 98 - 111 mmol/L   CO2 24 22 - 32 mmol/L   Glucose, Bld 86 70 - 99 mg/dL    Comment: Glucose reference range applies only to samples taken after fasting for at least 8 hours.   BUN 10 6 - 20 mg/dL   Creatinine, Ser 1.49 0.44 - 1.00 mg/dL   Calcium 9.3 8.9 - 70.2 mg/dL   Total Protein 8.5 (H) 6.5 - 8.1 g/dL   Albumin 4.4 3.5 - 5.0 g/dL   AST 14 (L) 15 - 41 U/L   ALT 11 0 - 44 U/L   Alkaline Phosphatase 57 38 - 126 U/L   Total Bilirubin 1.3 (H) 0.3 - 1.2 mg/dL   GFR, Estimated >63 >78 mL/min    Comment: (NOTE) Calculated using the CKD-EPI Creatinine Equation (2021)    Anion gap 12 5 - 15  Lipase, blood     Status: None   Collection Time: 02/03/21  6:37 PM  Result Value Ref Range   Lipase 14 11 - 51 U/L  Urinalysis, Routine w reflex microscopic Urine, Clean Catch  Status: Abnormal   Collection Time: 02/03/21  6:37 PM  Result Value Ref Range   Color, Urine BROWN (A) YELLOW    Comment: BIOCHEMICALS MAY BE AFFECTED BY COLOR   APPearance TURBID (A) CLEAR   Specific Gravity, Urine 1.032 (H) 1.005 - 1.030   pH 5.5 5.0 - 8.0   Glucose, UA NEGATIVE NEGATIVE mg/dL   Hgb urine dipstick LARGE (A) NEGATIVE   Bilirubin Urine NEGATIVE NEGATIVE   Ketones, ur >80 (A) NEGATIVE mg/dL   Protein, ur 628 (A) NEGATIVE mg/dL   Nitrite NEGATIVE NEGATIVE   Leukocytes,Ua SMALL (A) NEGATIVE   RBC / HPF >50 (H) 0 - 5 RBC/hpf   WBC, UA >50 (H) 0 - 5 WBC/hpf   Squamous Epithelial / LPF 0-5 0 - 5   WBC Clumps PRESENT    Budding Yeast PRESENT    Non Squamous Epithelial 0-5 (A) NONE SEEN   Crystals PRESENT (A) NEGATIVE  Pregnancy, urine     Status: None   Collection Time: 02/03/21  6:37 PM  Result Value Ref Range   Preg Test, Ur NEGATIVE NEGATIVE    Comment:        THE SENSITIVITY OF THIS METHODOLOGY IS >20 mIU/mL.   Wet prep, genital     Status: Abnormal   Collection Time: 02/03/21  9:34 PM  Result Value Ref Range   Yeast Wet Prep HPF POC PRESENT  (A) NONE SEEN   Trich, Wet Prep NONE SEEN NONE SEEN   Clue Cells Wet Prep HPF POC NONE SEEN NONE SEEN   WBC, Wet Prep HPF POC MANY (A) NONE SEEN   Sperm NONE SEEN   Resp Panel by RT-PCR (Flu A&B, Covid) Nasopharyngeal Swab     Status: None   Collection Time: 02/03/21 10:41 PM   Specimen: Nasopharyngeal Swab; Nasopharyngeal(NP) swabs in vial transport medium  Result Value Ref Range   SARS Coronavirus 2 by RT PCR NEGATIVE NEGATIVE   Influenza A by PCR NEGATIVE NEGATIVE   Influenza B by PCR NEGATIVE NEGATIVE  CBC WITH DIFFERENTIAL     Status: Abnormal   Collection Time: 02/04/21  5:23 AM  Result Value Ref Range   WBC 7.7 4.0 - 10.5 K/uL   RBC 3.99 3.87 - 5.11 MIL/uL   Hemoglobin 10.0 (L) 12.0 - 15.0 g/dL   HCT 36.6 (L) 29.4 - 76.5 %   MCV 76.7 (L) 80.0 - 100.0 fL   MCH 25.1 (L) 26.0 - 34.0 pg   MCHC 32.7 30.0 - 36.0 g/dL   RDW 46.5 (H) 03.5 - 46.5 %   Platelets 263 150 - 400 K/uL   nRBC 0.0 0.0 - 0.2 %   Neutrophils Relative % 79 %   Neutro Abs 6.0 1.7 - 7.7 K/uL   Lymphocytes Relative 15 %   Lymphs Abs 1.1 0.7 - 4.0 K/uL   Monocytes Relative 6 %   Monocytes Absolute 0.5 0.1 - 1.0 K/uL   Eosinophils Relative 0 %   Eosinophils Absolute 0.0 0.0 - 0.5 K/uL   Basophils Relative 0 %   Basophils Absolute 0.0 0.0 - 0.1 K/uL   Immature Granulocytes 0 %   Abs Immature Granulocytes 0.02 0.00 - 0.07 K/uL    Comment: Performed at [facility], [address], [place], [place] [address]  Rapid HIV screen (HIV 1/2 Ab+Ag)     Status: None   Collection Time: 02/04/21  5:23 AM  Result Value Ref Range   HIV-1 P24 Antigen - HIV24 NON REACTIVE  NON REACTIVE    Comment: (NOTE) Detection of p24 may be inhibited by biotin in the sample, causing false negative results in acute infection.    HIV 1/2 Antibodies NON REACTIVE NON REACTIVE   Interpretation (HIV Ag Ab)      A non reactive test result means that HIV 1 or HIV 2 antibodies and HIV 1 p24 antigen were not detected in the specimen.     Comment: Performed at [facility], [address] [place], [place] [address]    CT ABDOMEN PELVIS W CONTRAST  Result Date: 02/03/2021 CLINICAL DATA:  Right lower quadrant pain, appendicitis. Nausea, emesis, decreased appetite. Last menstrual period 02/02/2021. EXAM: CT ABDOMEN AND PELVIS WITH CONTRAST TECHNIQUE: Multidetector CT imaging of the abdomen and pelvis was performed using the standard protocol following bolus administration of intravenous contrast. CONTRAST:  OMNIPAQUE IOHEXOL 300 MG/ML  SOLN COMPARISON:  None. FINDINGS: Lower chest: No acute abnormality. Hepatobiliary: There is a 1.6 cm heterogeneous lesion within the right hepatic lobe. No focal liver abnormality. Punctate foci of gas within the gallbladder lumen related to gallstones. No inflammatory changes of the gallbladder noted. No gas within the gallbladder low wall. No gallbladder gallbladder wall thickening, or pericholecystic fluid. No biliary dilatation. Pancreas: No focal lesion. Normal pancreatic contour. No surrounding inflammatory changes. No main pancreatic ductal dilatation. Spleen: Normal in size without focal abnormality. Adrenals/Urinary Tract: No adrenal nodule bilaterally. Bilateral kidneys enhance symmetrically. No hydronephrosis. No hydroureter. The urinary bladder is unremarkable. Stomach/Bowel: Stomach is within normal limits. No evidence of small or large bowel wall thickening or dilatation. Appendix: Not definitely identified; however, right lower quadrant demonstrates inflammatory changes with suggestion of a heterogeneous complex 4.5 x 4.2 cm right lower quadrant/right adnexal lesion that could represent a finding associated with the appendix the right ovary. Vascular/Lymphatic: No significant vascular findings are present. No enlarged abdominal or pelvic lymph nodes. Reproductive: Please see appendix section regarding right adnexa. The left adnexa/ovary and uterus are unremarkable. Other: Trace right  lower quadrant free fluid. No intraperitoneal free gas. Musculoskeletal: No acute or significant osseous findings. IMPRESSION: 1. Right lower quadrant demonstrates inflammatory changes with suggestion of a heterogeneous complex 4.5 x 4.2 cm right lower quadrant/right adnexal lesion. Differential diagnosis includes perforated acute appendicitis with associated abscess formation versus tubo-ovarian abscess. 2. Cholelithiasis with no findings of acute cholecystitis. 3. Indeterminate 1.6 cm heterogeneous lesion within the right hepatic lobe that could represent a hepatic hemangioma. Consider MRI liver protocol for further evaluation. Electronically Signed   By: Tish Frederickson M.D.   On: 02/03/2021 20:27   US PELVIC COMPLETE W TRANSVAGINAL AND TORSION R/O  Result Date: 02/03/2021 CLINICAL DATA:  Right lower quadrant pain. EXAM: TRANSABDOMINAL AND TRANSVAGINAL ULTRASOUND OF PELVIS DOPPLER ULTRASOUND OF OVARIES TECHNIQUE: Both transabdominal and transvaginal ultrasound examinations of the pelvis were performed. Transabdominal technique was performed for global imaging of the pelvis including uterus, ovaries, adnexal regions, and pelvic cul-de-sac. It was necessary to proceed with endovaginal exam following the transabdominal exam to visualize the uterus, endometrium, bilateral ovaries and bilateral adnexa. Color and duplex Doppler ultrasound was utilized to evaluate blood flow to the ovaries. COMPARISON:  None. FINDINGS: Uterus Measurements: 10.7 cm x 5.0 cm x 6.6 cm = volume: 186 mL. No fibroids or other mass visualized. Endometrium Thickness: 12.9 mm.  No focal abnormality visualized. Right ovary The right ovary is not clearly visualized. A 7.1 cm x 3.4 cm x 5.4 cm ill-defined area of heterogeneous hypoechogenicity is seen within the  right adnexa. Portions of this area are hyperemic on color Doppler evaluation. Left ovary Measurements: 2.3 cm x 1.6 cm x 1.9 cm = volume: 3.7 mL. Normal appearance/no adnexal mass.  Pulsed Doppler evaluation of the left ovary demonstrates normal low-resistance arterial and venous waveforms. Other findings A trace amount of pelvic free fluid is seen. IMPRESSION: Large heterogeneous, hyperemic area within the right adnexa which corresponds to the findings seen within this region on the abdomen pelvis CT dated February 03, 2021. Surgical consultation and further evaluation with MRI is recommended, as a tubo-ovarian abscess cannot be excluded. Electronically Signed   By: Aram Candelahaddeus  Houston M.D.   On: 02/03/2021 23:01    Review of Systems  Constitutional: Negative for chills and fever.  HENT: Negative for hearing loss.   Eyes: Negative for blurred vision and double vision.  Respiratory: Negative for cough and hemoptysis.   Cardiovascular: Negative for chest pain and palpitations.  Gastrointestinal: Positive for abdominal pain, constipation, diarrhea, nausea and vomiting.  Genitourinary: Negative for dysuria and urgency.  Musculoskeletal: Negative for myalgias and neck pain.  Skin: Negative for itching and rash.  Neurological: Negative for dizziness, tingling and headaches.  Endo/Heme/Allergies: Does not bruise/bleed easily.  Psychiatric/Behavioral: Negative for depression and suicidal ideas.   PE Blood pressure (!) 101/52, pulse 88, temperature 98.5 F (36.9 C), temperature source Oral, resp. rate 17, height 5\' 2"  (1.575 m), weight 79.1 kg, last menstrual period 02/02/2021, SpO2 100 %, not currently breastfeeding. Constitutional: NAD; conversant; no deformities Eyes: Moist conjunctiva; no lid lag; anicteric; PERRL Neck: Trachea midline; no thyromegaly Lungs: Normal respiratory effort; no tactile fremitus CV: RRR; no palpable thrills; no pitting edema GI: Abd right lower quadrant tenderness; no palpable hepatosplenomegaly MSK: Normal gait; no clubbing/cyanosis Psychiatric: Appropriate affect; alert and oriented x3 Lymphatic: No palpable cervical or axillary  lymphadenopathy Skin: No major subcutaneous nodules. Warm and dry   Assessment/Plan: [age] yo female with RLQ pain and abscess of CT and US. It is difficult to determine etiology by imaging. Given the positive wet prep thoughts do tilt toward gynecologic origin. Regardless the treatment of the abscess is the same with antibiotics +/- IR drainage. -I do not think this is likely appendicitis in nature and do not think she requires surgery at this time.  De BlanchLuke Aaron Deltha Bernales 02/04/2021, 8:07 AM

## 2021-02-04 NOTE — ED Notes (Signed)
Pt aao4, [id number], denies any abd pain at this time, states she had first onset of s/s yesterday and had immediate relief of abd pain tonight. VSS on CCM, sinus rhythm, no nausea or vomiting at this time.

## 2021-02-04 NOTE — H&P (Signed)
Preoperative History and Physical  Tracy Hansen is a 30 y.o. [phone] with Patient's last menstrual period was 02/02/2021. admitted for a IV antibiotics for right TOA.Marland Kitchen   Pt presents to the [facility] ED today with abdominal pain.  She said it started yesterday.  She's had n/v.  She has not had an appetite.  No fever.  She is on her period now.  She has never had anything like this in the past.  Sonogram/CT reveal a 4.5 cm complex right lower quadrant/adnexal massconsistent with an abscess, TOA vs appendix.  I reviewed scans and lean TOA as did Dr Aldean Ast, general surgery.  We are not ruling out appendiceal abscess but feel it is less likely given the clinical presentation.  Patient is admitted for IV antibiotics and observation   PMH:    Past Medical History:  Diagnosis Date  . Anemia   . Asthma   . Hypothyroidism     PSH:     Past Surgical History:  Procedure Laterality Date  . CESAREAN SECTION     x 4    POb/GynH:      OB History    Gravida  4   Para  4   Term  3   Preterm  1   AB      Living  4     SAB      IAB      Ectopic      Multiple      Live Births           Obstetric Comments  C section x 4, with BTL with last section 01/2020        SH:   Social History   Tobacco Use  . Smoking status: Never Smoker  . Smokeless tobacco: Never Used  Vaping Use  . Vaping Use: Never used  Substance Use Topics  . Alcohol use: Yes    Comment: Social  . Drug use: Never    FH:   History reviewed. No pertinent family history.   Allergies:  Allergies  Allergen Reactions  . Suprax [Cefixime]     Rash    Medications:       Current Facility-Administered Medications:  .  albuterol (VENTOLIN HFA) 108 (90 Base) MCG/ACT inhaler 1-2 puff, 1-2 puff, Inhalation, Q6H, Faheem Ziemann, Amaryllis Dyke, MD .  bisacodyl (DULCOLAX) suppository 10 mg, 10 mg, Rectal, Daily PRN, Lazaro Arms, MD .  cefoTEtan (CEFOTAN) 2 g in sodium chloride 0.9 % 100 mL IVPB, 2 g,  Intravenous, Q12H, Raedyn Wenke H, MD .  dextrose 5 % and 0.45 % NaCl with KCl 20 mEq/L infusion, , Intravenous, Continuous, Lazaro Arms, MD, Last Rate: 125 mL/hr at 02/04/21 0745, Restarted at 02/04/21 0745 .  docusate sodium (COLACE) capsule 100 mg, 100 mg, Oral, BID, Kassius Battiste, Amaryllis Dyke, MD .  doxycycline (VIBRAMYCIN) 100 mg in sodium chloride 0.9 % 250 mL IVPB, 100 mg, Intravenous, Q12H, Lazaro Arms, MD, Last Rate: 125 mL/hr at 02/04/21 0745, Restarted at 02/04/21 0745 .  enoxaparin (LOVENOX) injection 40 mg, 40 mg, Subcutaneous, Q24H, Lazaro Arms, MD, 40 mg at 02/04/21 5638 .  feeding supplement (ENSURE ENLIVE / ENSURE PLUS) liquid 237 mL, 237 mL, Oral, BID BM, Graysyn Bache H, MD .  fentaNYL (SUBLIMAZE) injection 50-100 mcg, 50-100 mcg, Intravenous, Q2H PRN, Lazaro Arms, MD .  ibuprofen (ADVIL) tablet 600 mg, 600 mg, Oral, Q6H PRN, Lazaro Arms, MD .  levothyroxine (SYNTHROID) tablet 50 mcg, 50  mcg, Oral, Daily, Lazaro Arms, MD, 50 mcg at 02/04/21 1610 .  loratadine (CLARITIN) tablet 10 mg, 10 mg, Oral, Daily, Grady Mohabir, Amaryllis Dyke, MD .  mometasone-formoterol (DULERA) 100-5 MCG/ACT inhaler 2 puff, 2 puff, Inhalation, BID, Despina Hidden, Amaryllis Dyke, MD .  ondansetron (ZOFRAN) tablet 8 mg, 8 mg, Oral, Q6H PRN **OR** ondansetron (ZOFRAN) 8 mg in sodium chloride 0.9 % 50 mL IVPB, 8 mg, Intravenous, Q6H PRN, Lazaro Arms, MD .  oxyCODONE-acetaminophen (PERCOCET/ROXICET) 5-325 MG per tablet 1-2 tablet, 1-2 tablet, Oral, Q3H PRN, Lazaro Arms, MD .  prenatal multivitamin tablet 1 tablet, 1 tablet, Oral, Q1200, Despina Hidden, Amaryllis Dyke, MD .  senna-docusate (Senokot-S) tablet 1 tablet, 1 tablet, Oral, QHS PRN, Lazaro Arms, MD  Review of Systems:   Review of Systems  Constitutional: Negative for fever, chills, weight loss, malaise/fatigue and diaphoresis.  HENT: Negative for hearing loss, ear pain, nosebleeds, congestion, sore throat, neck pain, tinnitus and ear discharge.   Eyes: Negative for blurred  vision, double vision, photophobia, pain, discharge and redness.  Respiratory: Negative for cough, hemoptysis, sputum production, shortness of breath, wheezing and stridor.   Cardiovascular: Negative for chest pain, palpitations, orthopnea, claudication, leg swelling and PND.  Gastrointestinal: Positive for abdominal pain RLQ. Negative for heartburn, nausea, vomiting, diarrhea, constipation, blood in stool and melena.  Genitourinary: Negative for dysuria, urgency, frequency, hematuria and flank pain.  Musculoskeletal: Negative for myalgias, back pain, joint pain and falls.  Skin: Negative for itching and rash.  Neurological: Negative for dizziness, tingling, tremors, sensory change, speech change, focal weakness, seizures, loss of consciousness, weakness and headaches.  Endo/Heme/Allergies: Negative for environmental allergies and polydipsia. Does not bruise/bleed easily.  Psychiatric/Behavioral: Negative for depression, suicidal ideas, hallucinations, memory loss and substance abuse. The patient is not nervous/anxious and does not have insomnia.      PHYSICAL EXAM:  Blood pressure (!) 101/52, pulse 88, temperature 98.5 F (36.9 C), temperature source Oral, resp. rate 17, height 5\' 2"  (1.575 m), weight 79.1 kg, last menstrual period 02/02/2021, SpO2 100 %, not currently breastfeeding.    Vitals reviewed. Constitutional: She is oriented to person, place, and time. She appears well-developed and well-nourished. She does not appear ill HENT:  Head: Normocephalic and atraumatic.  Right Ear: External ear normal.  Left Ear: External ear normal.  Nose: Nose normal.  Mouth/Throat: Oropharynx is clear and moist.  Eyes: Conjunctivae and EOM are normal. Pupils are equal, round, and reactive to light. Right eye exhibits no discharge. Left eye exhibits no discharge. No scleral icterus.  Neck: Normal range of motion. Neck supple. No tracheal deviation present. No thyromegaly present.  Cardiovascular:  Normal rate, regular rhythm, normal heart sounds and intact distal pulses.  Exam reveals no gallop and no friction rub.   No murmur heard. Respiratory: Effort normal and breath sounds normal. No respiratory distress. She has no wheezes. She has no rales. She exhibits no tenderness.  GI: Soft. Bowel sounds are normal. She exhibits no distension and no mass. There is tenderness. In the RLQ moderate There is no rebound and no guarding.  Genitourinary:      Gyn/pelvic exam per sonogram Musculoskeletal: Normal range of motion. She exhibits no edema and no tenderness.  Neurological: She is alert and oriented to person, place, and time. She has normal reflexes. She displays normal reflexes. No cranial nerve deficit. She exhibits normal muscle tone. Coordination normal.  Skin: Skin is warm and dry. No rash noted. No erythema. No pallor.  Psychiatric: She  has a normal mood and affect. Her behavior is normal. Judgment and thought content normal.    Labs: Results for orders placed or performed during the hospital encounter of 02/03/21 (from the past 336 hour(s))  CBC with Differential   Collection Time: 02/03/21  6:37 PM  Result Value Ref Range   WBC 8.9 4.0 - 10.5 K/uL   RBC 4.74 3.87 - 5.11 MIL/uL   Hemoglobin 11.3 (L) 12.0 - 15.0 g/dL   HCT 79.0 24.0 - 97.3 %   MCV 76.4 (L) 80.0 - 100.0 fL   MCH 23.8 (L) 26.0 - 34.0 pg   MCHC 31.2 30.0 - 36.0 g/dL   RDW 53.2 (H) 99.2 - 42.6 %   Platelets 295 150 - 400 K/uL   nRBC 0.0 0.0 - 0.2 %   Neutrophils Relative % 83 %   Neutro Abs 7.2 1.7 - 7.7 K/uL   Lymphocytes Relative 12 %   Lymphs Abs 1.1 0.7 - 4.0 K/uL   Monocytes Relative 5 %   Monocytes Absolute 0.5 0.1 - 1.0 K/uL   Eosinophils Relative 0 %   Eosinophils Absolute 0.0 0.0 - 0.5 K/uL   Basophils Relative 0 %   Basophils Absolute 0.0 0.0 - 0.1 K/uL   Immature Granulocytes 0 %   Abs Immature Granulocytes 0.02 0.00 - 0.07 K/uL  Comprehensive metabolic panel   Collection Time: 02/03/21  6:37  PM  Result Value Ref Range   Sodium 137 135 - 145 mmol/L   Potassium 3.7 3.5 - 5.1 mmol/L   Chloride 101 98 - 111 mmol/L   CO2 24 22 - 32 mmol/L   Glucose, Bld 86 70 - 99 mg/dL   BUN 10 6 - 20 mg/dL   Creatinine, Ser 8.34 0.44 - 1.00 mg/dL   Calcium 9.3 8.9 - 19.6 mg/dL   Total Protein 8.5 (H) 6.5 - 8.1 g/dL   Albumin 4.4 3.5 - 5.0 g/dL   AST 14 (L) 15 - 41 U/L   ALT 11 0 - 44 U/L   Alkaline Phosphatase 57 38 - 126 U/L   Total Bilirubin 1.3 (H) 0.3 - 1.2 mg/dL   GFR, Estimated >22 >29 mL/min   Anion gap 12 5 - 15  Lipase, blood   Collection Time: 02/03/21  6:37 PM  Result Value Ref Range   Lipase 14 11 - 51 U/L  Urinalysis, Routine w reflex microscopic Urine, Clean Catch   Collection Time: 02/03/21  6:37 PM  Result Value Ref Range   Color, Urine BROWN (A) YELLOW   APPearance TURBID (A) CLEAR   Specific Gravity, Urine 1.032 (H) 1.005 - 1.030   pH 5.5 5.0 - 8.0   Glucose, UA NEGATIVE NEGATIVE mg/dL   Hgb urine dipstick LARGE (A) NEGATIVE   Bilirubin Urine NEGATIVE NEGATIVE   Ketones, ur >80 (A) NEGATIVE mg/dL   Protein, ur 798 (A) NEGATIVE mg/dL   Nitrite NEGATIVE NEGATIVE   Leukocytes,Ua SMALL (A) NEGATIVE   RBC / HPF >50 (H) 0 - 5 RBC/hpf   WBC, UA >50 (H) 0 - 5 WBC/hpf   Squamous Epithelial / LPF 0-5 0 - 5   WBC Clumps PRESENT    Budding Yeast PRESENT    Non Squamous Epithelial 0-5 (A) NONE SEEN   Crystals PRESENT (A) NEGATIVE  Pregnancy, urine   Collection Time: 02/03/21  6:37 PM  Result Value Ref Range   Preg Test, Ur NEGATIVE NEGATIVE  Wet prep, genital   Collection Time: 02/03/21  9:34 PM  Result Value Ref Range   Yeast Wet Prep HPF POC PRESENT (A) NONE SEEN   Trich, Wet Prep NONE SEEN NONE SEEN   Clue Cells Wet Prep HPF POC NONE SEEN NONE SEEN   WBC, Wet Prep HPF POC MANY (A) NONE SEEN   Sperm NONE SEEN   Resp Panel by RT-PCR (Flu A&B, Covid) Nasopharyngeal Swab   Collection Time: 02/03/21 10:41 PM   Specimen: Nasopharyngeal Swab; Nasopharyngeal(NP)  swabs in vial transport medium  Result Value Ref Range   SARS Coronavirus 2 by RT PCR NEGATIVE NEGATIVE   Influenza A by PCR NEGATIVE NEGATIVE   Influenza B by PCR NEGATIVE NEGATIVE  CBC WITH DIFFERENTIAL   Collection Time: 02/04/21  5:23 AM  Result Value Ref Range   WBC 7.7 4.0 - 10.5 K/uL   RBC 3.99 3.87 - 5.11 MIL/uL   Hemoglobin 10.0 (L) 12.0 - 15.0 g/dL   HCT 16.130.6 (L) 09.636.0 - 04.546.0 %   MCV 76.7 (L) 80.0 - 100.0 fL   MCH 25.1 (L) 26.0 - 34.0 pg   MCHC 32.7 30.0 - 36.0 g/dL   RDW 40.916.9 (H) 81.111.5 - 91.415.5 %   Platelets 263 150 - 400 K/uL   nRBC 0.0 0.0 - 0.2 %   Neutrophils Relative % 79 %   Neutro Abs 6.0 1.7 - 7.7 K/uL   Lymphocytes Relative 15 %   Lymphs Abs 1.1 0.7 - 4.0 K/uL   Monocytes Relative 6 %   Monocytes Absolute 0.5 0.1 - 1.0 K/uL   Eosinophils Relative 0 %   Eosinophils Absolute 0.0 0.0 - 0.5 K/uL   Basophils Relative 0 %   Basophils Absolute 0.0 0.0 - 0.1 K/uL   Immature Granulocytes 0 %   Abs Immature Granulocytes 0.02 0.00 - 0.07 K/uL  Rapid HIV screen (HIV 1/2 Ab+Ag)   Collection Time: 02/04/21  5:23 AM  Result Value Ref Range   HIV-1 P24 Antigen - HIV24 NON REACTIVE NON REACTIVE   HIV 1/2 Antibodies NON REACTIVE NON REACTIVE   Interpretation (HIV Ag Ab)      A non reactive test result means that HIV 1 or HIV 2 antibodies and HIV 1 p24 antigen were not detected in the specimen.    EKG: No orders found for this or any previous visit.  Imaging Studies: CT ABDOMEN PELVIS W CONTRAST  Result Date: 02/03/2021 CLINICAL DATA:  Right lower quadrant pain, appendicitis. Nausea, emesis, decreased appetite. Last menstrual period 02/02/2021. EXAM: CT ABDOMEN AND PELVIS WITH CONTRAST TECHNIQUE: Multidetector CT imaging of the abdomen and pelvis was performed using the standard protocol following bolus administration of intravenous contrast. CONTRAST:  100mL OMNIPAQUE IOHEXOL 300 MG/ML  SOLN COMPARISON:  None. FINDINGS: Lower chest: No acute abnormality. Hepatobiliary:  There is a 1.6 cm heterogeneous lesion within the right hepatic lobe. No focal liver abnormality. Punctate foci of gas within the gallbladder lumen related to gallstones. No inflammatory changes of the gallbladder noted. No gas within the gallbladder low wall. No gallbladder gallbladder wall thickening, or pericholecystic fluid. No biliary dilatation. Pancreas: No focal lesion. Normal pancreatic contour. No surrounding inflammatory changes. No main pancreatic ductal dilatation. Spleen: Normal in size without focal abnormality. Adrenals/Urinary Tract: No adrenal nodule bilaterally. Bilateral kidneys enhance symmetrically. No hydronephrosis. No hydroureter. The urinary bladder is unremarkable. Stomach/Bowel: Stomach is within normal limits. No evidence of small or large bowel wall thickening or dilatation. Appendix: Not definitely identified; however, right lower quadrant demonstrates inflammatory changes with suggestion of a heterogeneous complex 4.5  x 4.2 cm right lower quadrant/right adnexal lesion that could represent a finding associated with the appendix the right ovary. Vascular/Lymphatic: No significant vascular findings are present. No enlarged abdominal or pelvic lymph nodes. Reproductive: Please see appendix section regarding right adnexa. The left adnexa/ovary and uterus are unremarkable. Other: Trace right lower quadrant free fluid. No intraperitoneal free gas. Musculoskeletal: No acute or significant osseous findings. IMPRESSION: 1. Right lower quadrant demonstrates inflammatory changes with suggestion of a heterogeneous complex 4.5 x 4.2 cm right lower quadrant/right adnexal lesion. Differential diagnosis includes perforated acute appendicitis with associated abscess formation versus tubo-ovarian abscess. 2. Cholelithiasis with no findings of acute cholecystitis. 3. Indeterminate 1.6 cm heterogeneous lesion within the right hepatic lobe that could represent a hepatic hemangioma. Consider MRI liver  protocol for further evaluation. Electronically Signed   By: Tish Frederickson M.D.   On: 02/03/2021 20:27   US PELVIC COMPLETE W TRANSVAGINAL AND TORSION R/O  Result Date: 02/03/2021 CLINICAL DATA:  Right lower quadrant pain. EXAM: TRANSABDOMINAL AND TRANSVAGINAL ULTRASOUND OF PELVIS DOPPLER ULTRASOUND OF OVARIES TECHNIQUE: Both transabdominal and transvaginal ultrasound examinations of the pelvis were performed. Transabdominal technique was performed for global imaging of the pelvis including uterus, ovaries, adnexal regions, and pelvic cul-de-sac. It was necessary to proceed with endovaginal exam following the transabdominal exam to visualize the uterus, endometrium, bilateral ovaries and bilateral adnexa. Color and duplex Doppler ultrasound was utilized to evaluate blood flow to the ovaries. COMPARISON:  None. FINDINGS: Uterus Measurements: 10.7 cm x 5.0 cm x 6.6 cm = volume: 186 mL. No fibroids or other mass visualized. Endometrium Thickness: 12.9 mm.  No focal abnormality visualized. Right ovary The right ovary is not clearly visualized. A 7.1 cm x 3.4 cm x 5.4 cm ill-defined area of heterogeneous hypoechogenicity is seen within the right adnexa. Portions of this area are hyperemic on color Doppler evaluation. Left ovary Measurements: 2.3 cm x 1.6 cm x 1.9 cm = volume: 3.7 mL. Normal appearance/no adnexal mass. Pulsed Doppler evaluation of the left ovary demonstrates normal low-resistance arterial and venous waveforms. Other findings A trace amount of pelvic free fluid is seen. IMPRESSION: Large heterogeneous, hyperemic area within the right adnexa which corresponds to the findings seen within this region on the abdomen pelvis CT dated February 03, 2021. Surgical consultation and further evaluation with MRI is recommended, as a tubo-ovarian abscess cannot be excluded. Electronically Signed   By: Aram Candela M.D.   On: 02/03/2021 23:01      Assessment: Patient Active Problem List   Diagnosis Date  Noted  . Tubo-ovarian abscess, right 02/04/2021    Plan: Pt received rocephin 2 grams and IV flagyl at [facility]  I have placed her on Cefotan and doxycycline  Surgery will stop by to see but I think this is gyn origin as did Dr Andrey Campanile  Anticipate 72 hours or so of IV antibiotics, reassess WBC tomorrow am Will consult IR if does not respond to the antibiotics   Patient understands and all questions answered  Lazaro Arms 02/04/2021 8:45 AM

## 2021-02-05 ENCOUNTER — Inpatient Hospital Stay (HOSPITAL_COMMUNITY): Payer: 59

## 2021-02-05 LAB — GC/CHLAMYDIA PROBE AMP (~~LOC~~) NOT AT ARMC
Chlamydia: NEGATIVE
Comment: NEGATIVE
Comment: NORMAL
Neisseria Gonorrhea: NEGATIVE

## 2021-02-05 IMAGING — CT CT ABD-PELV W/ CM
2 of 4 series · 16 of 46 positions shown, 18 images · IV contrast (Omni 300)
Comparison: [DATE]

CLINICAL DATA: Right lower quadrant pain, previous abnormal CT and
ultrasound

EXAM:
CT ABDOMEN AND PELVIS WITH CONTRAST
TECHNIQUE: Multidetector CT imaging of the abdomen and pelvis was performed
using the standard protocol following bolus administration of
intravenous contrast.
CONTRAST:  100mL OMNIPAQUE IOHEXOL 300 MG/ML  SOLN

[Series 3: a/p w/ 5mm · axial · 0.92mm/px · z∈[-765,-375]mm · 13 of 86 slices shown, 15 images]
[im 4/86  soft-tissue]
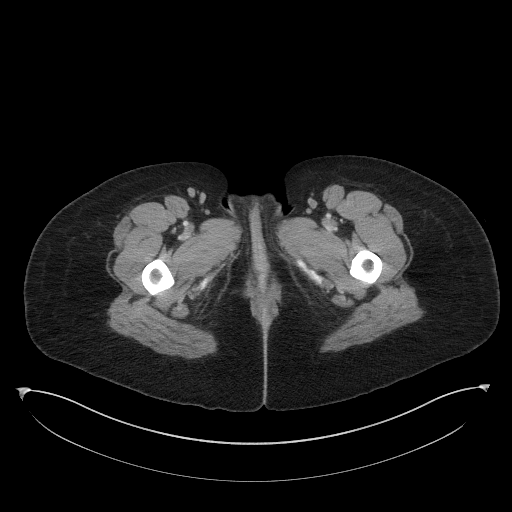
[im 4/86  bone]
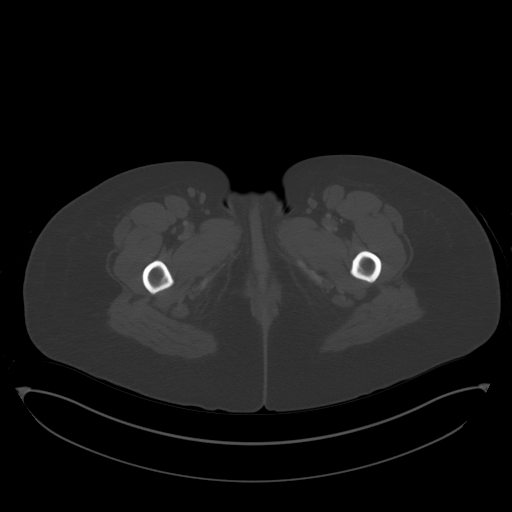
[im 12/86  soft-tissue]
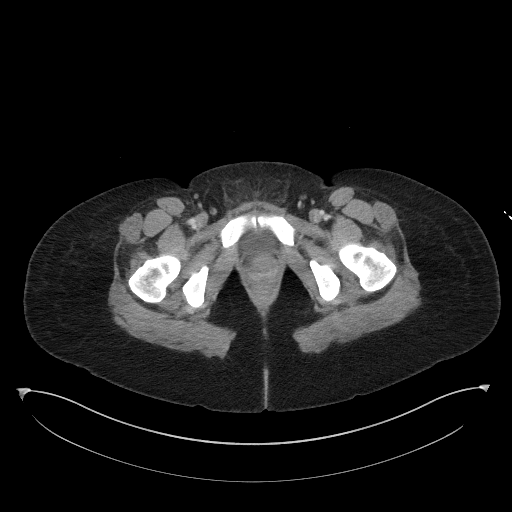
[im 19/86  soft-tissue]
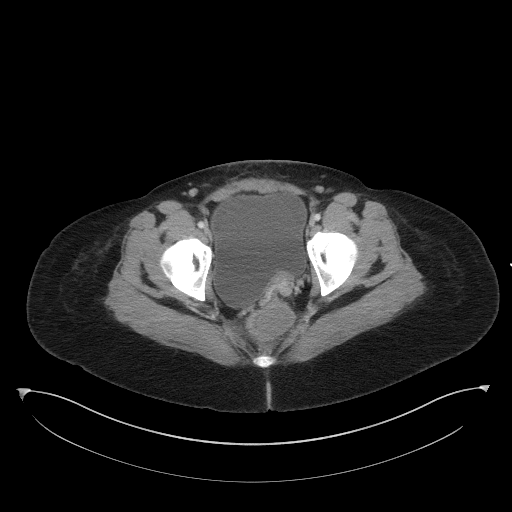
[im 23/86  soft-tissue]
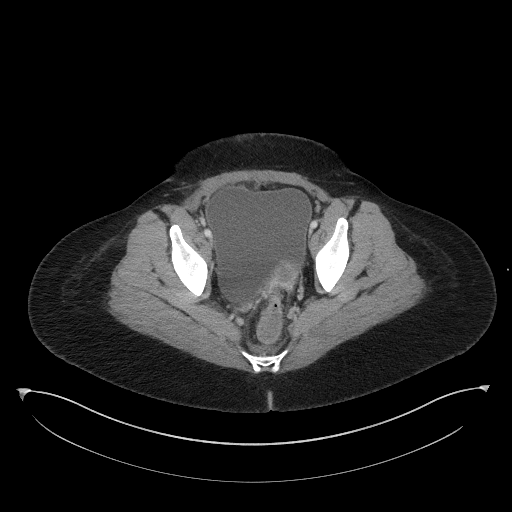
[im 30/86  soft-tissue]
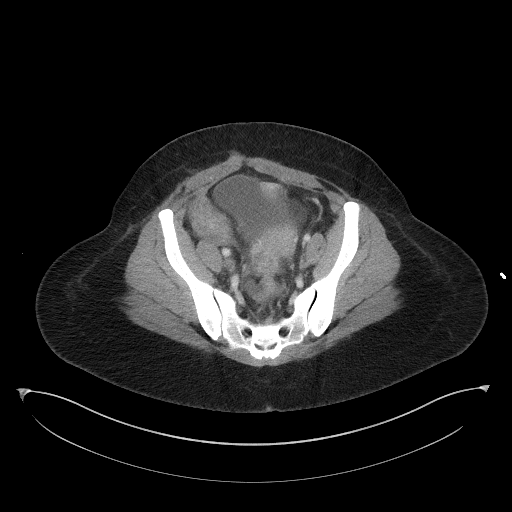
[im 37/86  soft-tissue]
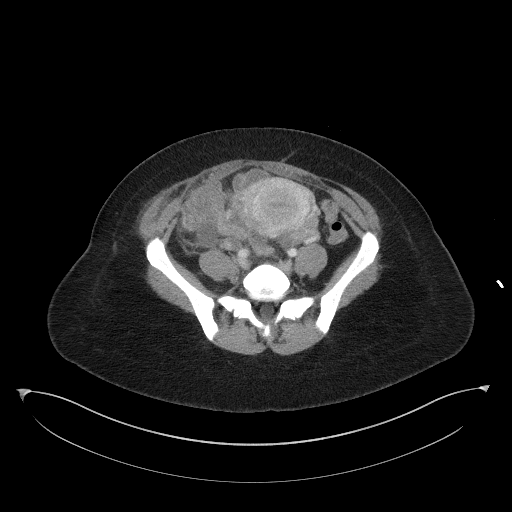
[im 45/86  soft-tissue]
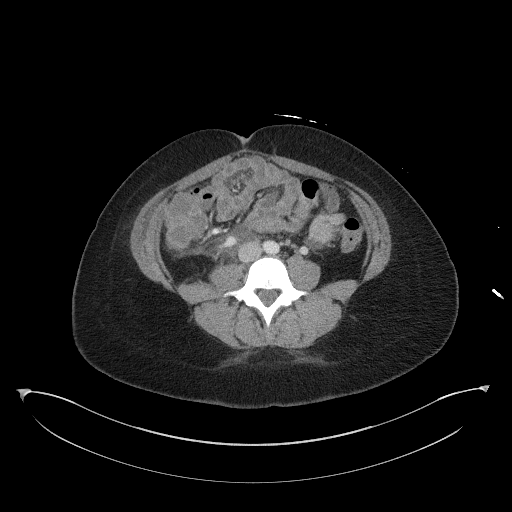
[im 49/86  soft-tissue]
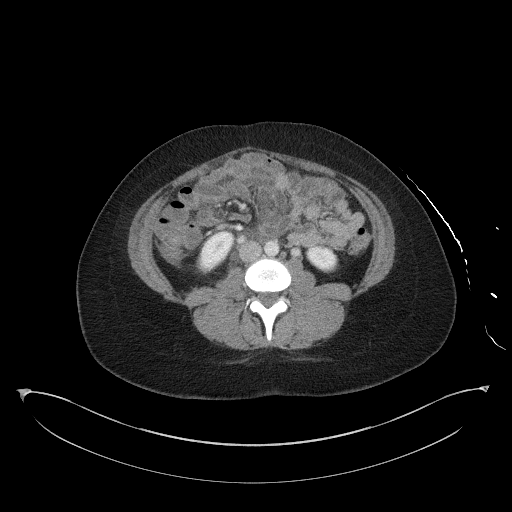
[im 56/86  soft-tissue]
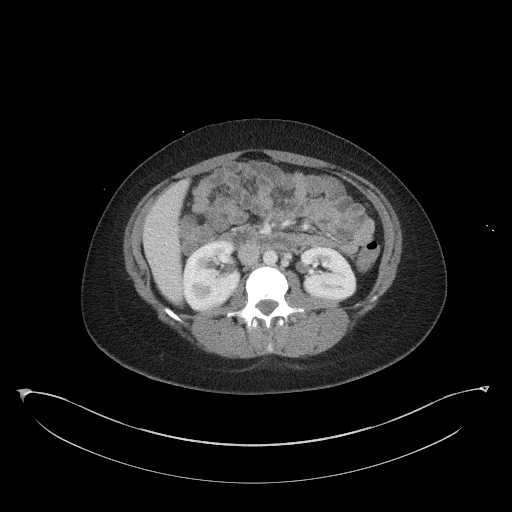
[im 56/86  bone]
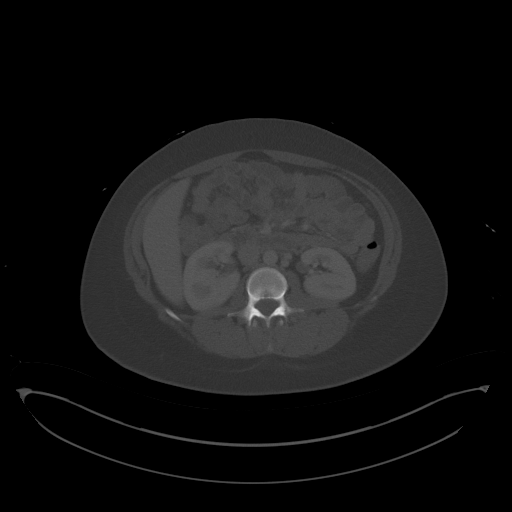
[im 63/86  soft-tissue]
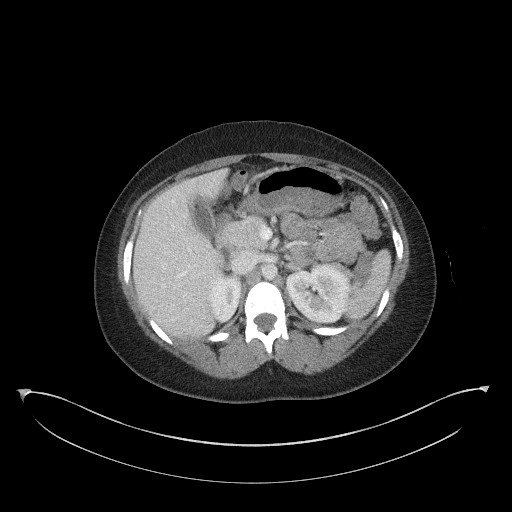
[im 67/86  soft-tissue]
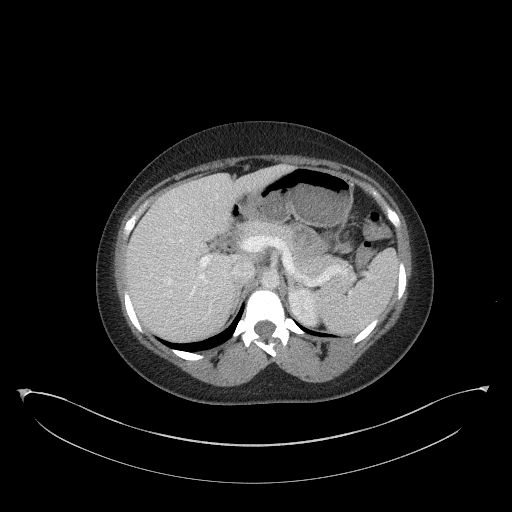
[im 74/86  soft-tissue]
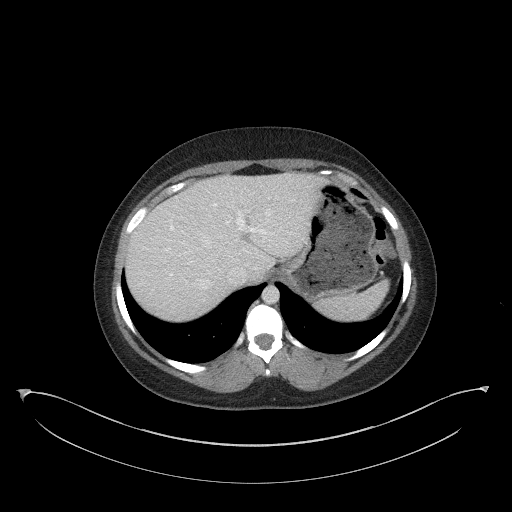
[im 82/86  soft-tissue]
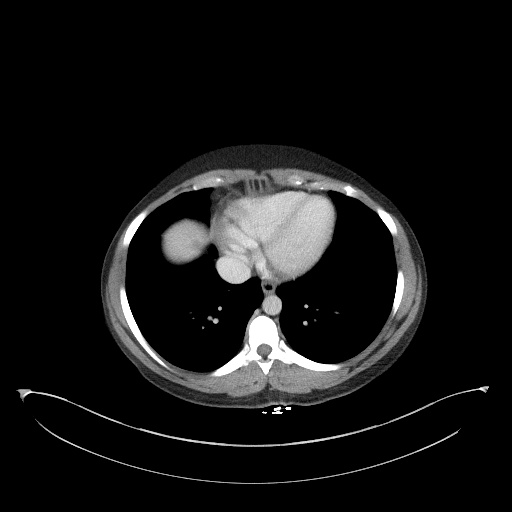

[Series 6: a/p w/ cor · coronal · 0.83mm/px · 3 of 142 slices shown]
[im 48/142  soft-tissue]
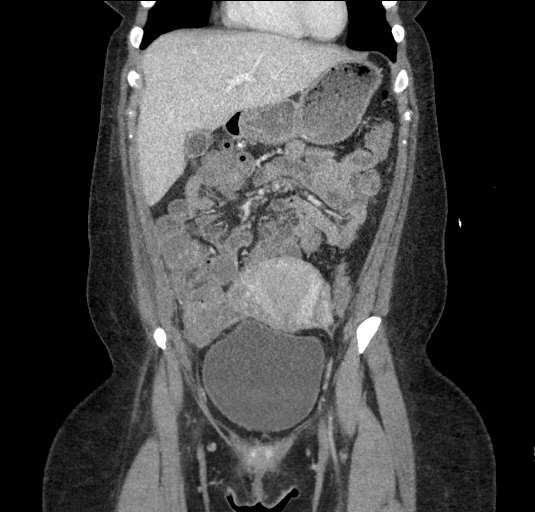
[im 63/142  soft-tissue]
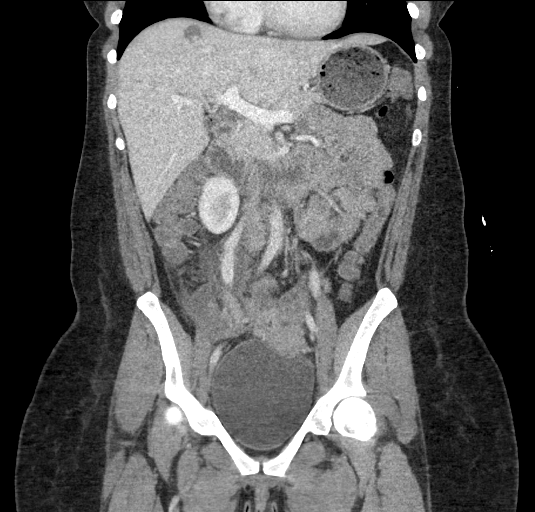
[im 79/142  soft-tissue]
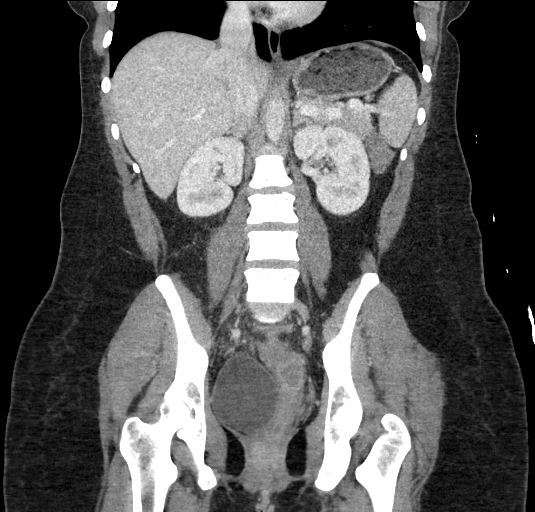

[16 of 46 positions shown; findings below may reference images not displayed]

FINDINGS: Lower chest: No acute pleural or parenchymal lung disease.

Hepatobiliary: Stable right lobe liver hypodensity measuring 1.5 cm,
favor hemangioma. Otherwise the liver is unremarkable. Noncalcified
gallstones again identified without cholecystitis.

Pancreas: Unremarkable. No pancreatic ductal dilatation or
surrounding inflammatory changes.

Spleen: Normal in size without focal abnormality.

Adrenals/Urinary Tract: Adrenal glands are unremarkable. Kidneys are
normal, without renal calculi, focal lesion, or hydronephrosis.
Bladder is unremarkable.

Stomach/Bowel: No bowel obstruction or ileus.

The inflammatory process in the right lower quadrant adjacent to the
cecum and right adnexa is again identified, measuring approximately
4.8 x 5.6 cm, slightly increased in size since prior study.
Evaluation of this region is limited without oral contrast
administration. The appendix cannot be clearly identified. The
differential diagnosis remains the same, including perforated
appendicitis with phlegmon/abscess, versus tubo-ovarian abscess.

Vascular/Lymphatic: No evidence of atherosclerosis. There is a
dilated patent right gonadal vein with inflammatory changes in the
surrounding fat, slightly more pronounced than prior study.
Subcentimeter likely reactive lymph nodes are seen in the right
lower quadrant mesentery. No pathologic adenopathy.

Reproductive: The uterus is unremarkable. No evidence of left
adnexal mass. The right adnexa is obscured by the inflammatory
process described above.

Other: Trace free fluid within the pelvis and tracking along the
right gonadal vein and into the right paracolic gutter. No free
intraperitoneal gas.

Musculoskeletal: No acute or destructive bony lesions. Interval
development of intramuscular edema within the right lower quadrant
abdominal wall overlying the inflammatory process described above.
This is nonspecific. Reconstructed images demonstrate no additional
findings.
IMPRESSION: 1. Slight increase in size in the inflammatory process in the right
lower quadrant as above. This could reflect perforated appendicitis
with abscess, versus tubo-ovarian abscess. A normal appendix cannot
be identified.
2. Interval development of inflammatory changes within the
musculature of the right lower quadrant abdominal wall, overlying
the inflammatory process described above. This is nonspecific.
3. Trace free fluid within the pelvis and right paracolic gutter. No
evidence of pneumoperitoneum.
4. Cholelithiasis without cholecystitis.

## 2021-02-05 MED ORDER — IOHEXOL 300 MG/ML  SOLN
100.0000 mL | Freq: Once | INTRAMUSCULAR | Status: AC | PRN
  Administered 2021-02-05: 100 mL via INTRAVENOUS

## 2021-02-05 NOTE — Progress Notes (Signed)
HD#2 Right TOA (presumed)   Subjective: Patient reports diminished pain, feels better, but never really felt too bad Eating No nausea or vomiting.    Objective: I have reviewed patient's vital signs, intake and output, medications, labs, microbiology and radiology results.  General: alert, cooperative and no distress GI: soft, non-tender; bowel sounds normal; no masses,  no organomegaly  Mildly tender now lees than yesterday no guarding no rebound  CBC Latest Ref Rng & Units 02/04/2021 02/03/2021  WBC 4.0 - 10.5 K/uL 7.7 8.9  Hemoglobin 12.0 - 15.0 g/dL 10.0(L) 11.3(L)  Hematocrit 36.0 - 46.0 % 30.6(L) 36.2  Platelets 150 - 400 K/uL 263 295   CT ABDOMEN PELVIS W CONTRAST  Result Date: 02/03/2021 CLINICAL DATA:  Right lower quadrant pain, appendicitis. Nausea, emesis, decreased appetite. Last menstrual period 02/02/2021. EXAM: CT ABDOMEN AND PELVIS WITH CONTRAST TECHNIQUE: Multidetector CT imaging of the abdomen and pelvis was performed using the standard protocol following bolus administration of intravenous contrast. CONTRAST:  OMNIPAQUE IOHEXOL 300 MG/ML  SOLN COMPARISON:  None. FINDINGS: Lower chest: No acute abnormality. Hepatobiliary: There is a 1.6 cm heterogeneous lesion within the right hepatic lobe. No focal liver abnormality. Punctate foci of gas within the gallbladder lumen related to gallstones. No inflammatory changes of the gallbladder noted. No gas within the gallbladder low wall. No gallbladder gallbladder wall thickening, or pericholecystic fluid. No biliary dilatation. Pancreas: No focal lesion. Normal pancreatic contour. No surrounding inflammatory changes. No main pancreatic ductal dilatation. Spleen: Normal in size without focal abnormality. Adrenals/Urinary Tract: No adrenal nodule bilaterally. Bilateral kidneys enhance symmetrically. No hydronephrosis. No hydroureter. The urinary bladder is unremarkable. Stomach/Bowel: Stomach is within normal limits. No evidence  of small or large bowel wall thickening or dilatation. Appendix: Not definitely identified; however, right lower quadrant demonstrates inflammatory changes with suggestion of a heterogeneous complex 4.5 x 4.2 cm right lower quadrant/right adnexal lesion that could represent a finding associated with the appendix the right ovary. Vascular/Lymphatic: No significant vascular findings are present. No enlarged abdominal or pelvic lymph nodes. Reproductive: Please see appendix section regarding right adnexa. The left adnexa/ovary and uterus are unremarkable. Other: Trace right lower quadrant free fluid. No intraperitoneal free gas. Musculoskeletal: No acute or significant osseous findings. IMPRESSION: 1. Right lower quadrant demonstrates inflammatory changes with suggestion of a heterogeneous complex 4.5 x 4.2 cm right lower quadrant/right adnexal lesion. Differential diagnosis includes perforated acute appendicitis with associated abscess formation versus tubo-ovarian abscess. 2. Cholelithiasis with no findings of acute cholecystitis. 3. Indeterminate 1.6 cm heterogeneous lesion within the right hepatic lobe that could represent a hepatic hemangioma. Consider MRI liver protocol for further evaluation. Electronically Signed   By: Tish Frederickson M.D.   On: 02/03/2021 20:27   US PELVIC COMPLETE W TRANSVAGINAL AND TORSION R/O  Result Date: 02/03/2021 CLINICAL DATA:  Right lower quadrant pain. EXAM: TRANSABDOMINAL AND TRANSVAGINAL ULTRASOUND OF PELVIS DOPPLER ULTRASOUND OF OVARIES TECHNIQUE: Both transabdominal and transvaginal ultrasound examinations of the pelvis were performed. Transabdominal technique was performed for global imaging of the pelvis including uterus, ovaries, adnexal regions, and pelvic cul-de-sac. It was necessary to proceed with endovaginal exam following the transabdominal exam to visualize the uterus, endometrium, bilateral ovaries and bilateral adnexa. Color and duplex Doppler ultrasound was  utilized to evaluate blood flow to the ovaries. COMPARISON:  None. FINDINGS: Uterus Measurements: 10.7 cm x 5.0 cm x 6.6 cm = volume: 186 mL. No fibroids or other mass visualized. Endometrium Thickness: 12.9 mm.  No focal abnormality visualized. Right ovary  The right ovary is not clearly visualized. A 7.1 cm x 3.4 cm x 5.4 cm ill-defined area of heterogeneous hypoechogenicity is seen within the right adnexa. Portions of this area are hyperemic on color Doppler evaluation. Left ovary Measurements: 2.3 cm x 1.6 cm x 1.9 cm = volume: 3.7 mL. Normal appearance/no adnexal mass. Pulsed Doppler evaluation of the left ovary demonstrates normal low-resistance arterial and venous waveforms. Other findings A trace amount of pelvic free fluid is seen. IMPRESSION: Large heterogeneous, hyperemic area within the right adnexa which corresponds to the findings seen within this region on the abdomen pelvis CT dated February 03, 2021. Surgical consultation and further evaluation with MRI is recommended, as a tubo-ovarian abscess cannot be excluded. Electronically Signed   By: Aram Candela M.D.   On: 02/03/2021 23:01    Assessment/Plan: Right sided abscess, presumed TOA, 7 cm:  Improved clinically as is WBC  Afebrile throughout Cultures from MCD are pending If deteriorates clinically, consider IR Continue cefotan and doxycycline for full 72 hours prior to consideration of discharge(02/07/21 am) Recommend discharge on augmentin, doxycycline    LOS: 1 day    Lazaro Arms 02/05/2021, 7:27 AM

## 2021-02-05 NOTE — Progress Notes (Signed)
Subjective/Chief Complaint: Feels better. Less abd pain   Objective: Vital signs in last 24 hours: Temp:  [98.2 F (36.8 C)-98.8 F (37.1 C)] 98.3 F (36.8 C) (03/14 0738) Pulse Rate:  [76-104] 76 (03/14 0738) Resp:  [17-18] 18 (03/14 0738) BP: (85-113)/(53-81) 98/54 (03/14 0738) SpO2:  [97 %-100 %] 100 % (03/14 0738) Weight:  [77.9 kg] 77.9 kg (03/14 0521) Last BM Date: 02/02/21  Intake/Output from previous day: 03/13 0701 - 03/14 0700 In: 3183.7 [P.O.:840; I.V.:1644; IV Piggyback:699.6] Out: 1072 [Urine:1072] Intake/Output this shift: No intake/output data recorded.  General appearance: alert and cooperative Resp: clear to auscultation bilaterally Cardio: regular rate and rhythm GI: soft, minimal RLQ tenderness  Lab Results:  Recent Labs    02/03/21 1837 02/04/21 0523  WBC 8.9 7.7  HGB 11.3* 10.0*  HCT 36.2 30.6*  PLT 295 263   BMET Recent Labs    02/03/21 1837  NA 137  K 3.7  CL 101  CO2 24  GLUCOSE 86  BUN 10  CREATININE 0.77  CALCIUM 9.3   PT/INR No results for input(s): LABPROT, INR in the last 72 hours. ABG No results for input(s): PHART, HCO3 in the last 72 hours.  Invalid input(s): PCO2, PO2  Studies/Results: CT ABDOMEN PELVIS W CONTRAST  Result Date: 02/03/2021 CLINICAL DATA:  Right lower quadrant pain, appendicitis. Nausea, emesis, decreased appetite. Last menstrual period 02/02/2021. EXAM: CT ABDOMEN AND PELVIS WITH CONTRAST TECHNIQUE: Multidetector CT imaging of the abdomen and pelvis was performed using the standard protocol following bolus administration of intravenous contrast. CONTRAST:  OMNIPAQUE IOHEXOL 300 MG/ML  SOLN COMPARISON:  None. FINDINGS: Lower chest: No acute abnormality. Hepatobiliary: There is a 1.6 cm heterogeneous lesion within the right hepatic lobe. No focal liver abnormality. Punctate foci of gas within the gallbladder lumen related to gallstones. No inflammatory changes of the gallbladder noted. No gas  within the gallbladder low wall. No gallbladder gallbladder wall thickening, or pericholecystic fluid. No biliary dilatation. Pancreas: No focal lesion. Normal pancreatic contour. No surrounding inflammatory changes. No main pancreatic ductal dilatation. Spleen: Normal in size without focal abnormality. Adrenals/Urinary Tract: No adrenal nodule bilaterally. Bilateral kidneys enhance symmetrically. No hydronephrosis. No hydroureter. The urinary bladder is unremarkable. Stomach/Bowel: Stomach is within normal limits. No evidence of small or large bowel wall thickening or dilatation. Appendix: Not definitely identified; however, right lower quadrant demonstrates inflammatory changes with suggestion of a heterogeneous complex 4.5 x 4.2 cm right lower quadrant/right adnexal lesion that could represent a finding associated with the appendix the right ovary. Vascular/Lymphatic: No significant vascular findings are present. No enlarged abdominal or pelvic lymph nodes. Reproductive: Please see appendix section regarding right adnexa. The left adnexa/ovary and uterus are unremarkable. Other: Trace right lower quadrant free fluid. No intraperitoneal free gas. Musculoskeletal: No acute or significant osseous findings. IMPRESSION: 1. Right lower quadrant demonstrates inflammatory changes with suggestion of a heterogeneous complex 4.5 x 4.2 cm right lower quadrant/right adnexal lesion. Differential diagnosis includes perforated acute appendicitis with associated abscess formation versus tubo-ovarian abscess. 2. Cholelithiasis with no findings of acute cholecystitis. 3. Indeterminate 1.6 cm heterogeneous lesion within the right hepatic lobe that could represent a hepatic hemangioma. Consider MRI liver protocol for further evaluation. Electronically Signed   By: Tish Frederickson M.D.   On: 02/03/2021 20:27   US PELVIC COMPLETE W TRANSVAGINAL AND TORSION R/O  Result Date: 02/03/2021 CLINICAL DATA:  Right lower quadrant pain.  EXAM: TRANSABDOMINAL AND TRANSVAGINAL ULTRASOUND OF PELVIS DOPPLER ULTRASOUND OF OVARIES TECHNIQUE: Both  transabdominal and transvaginal ultrasound examinations of the pelvis were performed. Transabdominal technique was performed for global imaging of the pelvis including uterus, ovaries, adnexal regions, and pelvic cul-de-sac. It was necessary to proceed with endovaginal exam following the transabdominal exam to visualize the uterus, endometrium, bilateral ovaries and bilateral adnexa. Color and duplex Doppler ultrasound was utilized to evaluate blood flow to the ovaries. COMPARISON:  None. FINDINGS: Uterus Measurements: 10.7 cm x 5.0 cm x 6.6 cm = volume: 186 mL. No fibroids or other mass visualized. Endometrium Thickness: 12.9 mm.  No focal abnormality visualized. Right ovary The right ovary is not clearly visualized. A 7.1 cm x 3.4 cm x 5.4 cm ill-defined area of heterogeneous hypoechogenicity is seen within the right adnexa. Portions of this area are hyperemic on color Doppler evaluation. Left ovary Measurements: 2.3 cm x 1.6 cm x 1.9 cm = volume: 3.7 mL. Normal appearance/no adnexal mass. Pulsed Doppler evaluation of the left ovary demonstrates normal low-resistance arterial and venous waveforms. Other findings A trace amount of pelvic free fluid is seen. IMPRESSION: Large heterogeneous, hyperemic area within the right adnexa which corresponds to the findings seen within this region on the abdomen pelvis CT dated February 03, 2021. Surgical consultation and further evaluation with MRI is recommended, as a tubo-ovarian abscess cannot be excluded. Electronically Signed   By: Aram Candela M.D.   On: 02/03/2021 23:01    Anti-infectives: Anti-infectives (From admission, onward)   Start     Dose/Rate Route Frequency Ordered Stop   02/04/21 1000  cefoTEtan (CEFOTAN) 2 g in sodium chloride 0.9 % 100 mL IVPB       Note to Pharmacy: receiv3d rocephin without issue   2 g 200 mL/hr over 30 Minutes Intravenous  Every 12 hours 02/04/21 0451     02/04/21 0600  doxycycline (VIBRAMYCIN) 100 mg in sodium chloride 0.9 % 250 mL IVPB        100 mg 125 mL/hr over 120 Minutes Intravenous Every 12 hours 02/04/21 0451     02/03/21 2145  cefTRIAXone (ROCEPHIN) 2 g in sodium chloride 0.9 % 100 mL IVPB       "And" Linked Group Details   2 g 200 mL/hr over 30 Minutes Intravenous  Once 02/03/21 2137 02/03/21 2315   02/03/21 2145  metroNIDAZOLE (FLAGYL) IVPB 500 mg       "And" Linked Group Details   500 mg 100 mL/hr over 60 Minutes Intravenous  Once 02/03/21 2137 02/04/21 0026      Assessment/Plan: s/p * No surgery found * Advance diet  TOA vs appendicitis. Clinically improving on IV abx. No fever and normal wbc Consider switching to oral abx and monitor No plan for surgery at this point  LOS: 1 day    Chevis Pretty III 02/05/2021

## 2021-02-06 NOTE — Progress Notes (Signed)
HD#3 Right TOA on cefotan/doxy, with good clinical response  Subjective: Patient reports no nausea, minimal to no abdominal pain Good appetite, feels much better.   No complaints  Objective: I have reviewed patient's vital signs, intake and output, medications, labs and radiology results.  General: alert, cooperative and no distress GI: soft non tender to deep paplpation RLQ no guarding no rebound Benign abdomen    CT ABDOMEN PELVIS W CONTRAST  Result Date: 02/05/2021 CLINICAL DATA:  Right lower quadrant pain, previous abnormal CT and ultrasound EXAM: CT ABDOMEN AND PELVIS WITH CONTRAST TECHNIQUE: Multidetector CT imaging of the abdomen and pelvis was performed using the standard protocol following bolus administration of intravenous contrast. CONTRAST:  OMNIPAQUE IOHEXOL 300 MG/ML  SOLN COMPARISON:  02/03/2021 FINDINGS: Lower chest: No acute pleural or parenchymal lung disease. Hepatobiliary: Stable right lobe liver hypodensity measuring 1.5 cm, favor hemangioma. Otherwise the liver is unremarkable. Noncalcified gallstones again identified without cholecystitis. Pancreas: Unremarkable. No pancreatic ductal dilatation or surrounding inflammatory changes. Spleen: Normal in size without focal abnormality. Adrenals/Urinary Tract: Adrenal glands are unremarkable. Kidneys are normal, without renal calculi, focal lesion, or hydronephrosis. Bladder is unremarkable. Stomach/Bowel: No bowel obstruction or ileus. The inflammatory process in the right lower quadrant adjacent to the cecum and right adnexa is again identified, measuring approximately 4.8 x 5.6 cm, slightly increased in size since prior study. Evaluation of this region is limited without oral contrast administration. The appendix cannot be clearly identified. The differential diagnosis remains the same, including perforated appendicitis with phlegmon/abscess, versus tubo-ovarian abscess. Vascular/Lymphatic: No evidence of atherosclerosis.  There is a dilated patent right gonadal vein with inflammatory changes in the surrounding fat, slightly more pronounced than prior study. Subcentimeter likely reactive lymph nodes are seen in the right lower quadrant mesentery. No pathologic adenopathy. Reproductive: The uterus is unremarkable. No evidence of left adnexal mass. The right adnexa is obscured by the inflammatory process described above. Other: Trace free fluid within the pelvis and tracking along the right gonadal vein and into the right paracolic gutter. No free intraperitoneal gas. Musculoskeletal: No acute or destructive bony lesions. Interval development of intramuscular edema within the right lower quadrant abdominal wall overlying the inflammatory process described above. This is nonspecific. Reconstructed images demonstrate no additional findings. IMPRESSION: 1. Slight increase in size in the inflammatory process in the right lower quadrant as above. This could reflect perforated appendicitis with abscess, versus tubo-ovarian abscess. A normal appendix cannot be identified. 2. Interval development of inflammatory changes within the musculature of the right lower quadrant abdominal wall, overlying the inflammatory process described above. This is nonspecific. 3. Trace free fluid within the pelvis and right paracolic gutter. No evidence of pneumoperitoneum. 4. Cholelithiasis without cholecystitis. Electronically Signed   By: Sharlet Salina M.D.   On: 02/05/2021 23:14   Assessment/Plan: Right TOA, presumed:negative GC/Chlamydia NAAT Good clinical response to antibiotics cefotan and doxycycline Will keep on 72 hours total of this regimen which would be until tomorrow am, then convert to augmentin/doxycycline oral CT ordered by Dr Carolynne Edouard is essentially unchanged, area involved is smaller than seen originally on sonogram  With clinical improvement I see no indication for surgery or IR at this point Anticipate discharge tomorrow Repeat WBC in  am    LOS: 2 days    Lazaro Arms 02/06/2021, 7:24 AM    Patient ID: Tracy Hansen, female   DOB: 1991/06/25, 30 y.o.   MRN: [id number]

## 2021-02-06 NOTE — Progress Notes (Signed)
[facility] Progress Note     Subjective: CC-  Continues to feel better. She reports mild RLQ discomfort with movement or when she urinates. Denies dysuria. Tolerating diet. Denies n/v.   Objective: Vital signs in last 24 hours: Temp:  [97.9 F (36.6 C)-99 F (37.2 C)] 97.9 F (36.6 C) (03/15 0741) Pulse Rate:  [74-90] 76 (03/15 0741) Resp:  [16-18] 17 (03/15 0741) BP: (101-115)/(64-71) 109/67 (03/15 0741) SpO2:  [100 %] 100 % (03/15 0741) Weight:  [77.8 kg] 77.8 kg (03/15 0431) Last BM Date: 02/02/21  Intake/Output from previous day: 03/14 0701 - 03/15 0700 In: 4452.9 [I.V.:2838.1; IV Piggyback:1614.8] Out: 3550 [Urine:3550] Intake/Output this shift: Total I/O In: 0  Out: 400 [Urine:400]  PE: Gen:  Alert, NAD, pleasant HEENT: EOM's intact, pupils equal and round Card:  RRR Pulm:  CTAB, no W/R/R, rate and effort normal Abd: Soft, NT/ND, +BS Psych: A&Ox4  Skin: no rashes noted, warm and dry  Lab Results:  Recent Labs    02/03/21 1837 02/04/21 0523  WBC 8.9 7.7  HGB 11.3* 10.0*  HCT 36.2 30.6*  PLT 295 263   BMET Recent Labs    02/03/21 1837  NA 137  K 3.7  CL 101  CO2 24  GLUCOSE 86  BUN 10  CREATININE 0.77  CALCIUM 9.3   PT/INR No results for input(s): LABPROT, INR in the last 72 hours. CMP     Component Value Date/Time   NA 137 02/03/2021 1837   K 3.7 02/03/2021 1837   CL 101 02/03/2021 1837   CO2 24 02/03/2021 1837   GLUCOSE 86 02/03/2021 1837   BUN 10 02/03/2021 1837   CREATININE 0.77 02/03/2021 1837   CALCIUM 9.3 02/03/2021 1837   PROT 8.5 (H) 02/03/2021 1837   ALBUMIN 4.4 02/03/2021 1837   AST 14 (L) 02/03/2021 1837   ALT 11 02/03/2021 1837   ALKPHOS 57 02/03/2021 1837   BILITOT 1.3 (H) 02/03/2021 1837   GFRNONAA >60 02/03/2021 1837   Lipase     Component Value Date/Time   LIPASE 14 02/03/2021 1837       Studies/Results: CT ABDOMEN PELVIS W CONTRAST  Result Date: 02/05/2021 CLINICAL DATA:  Right lower  quadrant pain, previous abnormal CT and ultrasound EXAM: CT ABDOMEN AND PELVIS WITH CONTRAST TECHNIQUE: Multidetector CT imaging of the abdomen and pelvis was performed using the standard protocol following bolus administration of intravenous contrast. CONTRAST:  OMNIPAQUE IOHEXOL 300 MG/ML  SOLN COMPARISON:  02/03/2021 FINDINGS: Lower chest: No acute pleural or parenchymal lung disease. Hepatobiliary: Stable right lobe liver hypodensity measuring 1.5 cm, favor hemangioma. Otherwise the liver is unremarkable. Noncalcified gallstones again identified without cholecystitis. Pancreas: Unremarkable. No pancreatic ductal dilatation or surrounding inflammatory changes. Spleen: Normal in size without focal abnormality. Adrenals/Urinary Tract: Adrenal glands are unremarkable. Kidneys are normal, without renal calculi, focal lesion, or hydronephrosis. Bladder is unremarkable. Stomach/Bowel: No bowel obstruction or ileus. The inflammatory process in the right lower quadrant adjacent to the cecum and right adnexa is again identified, measuring approximately 4.8 x 5.6 cm, slightly increased in size since prior study. Evaluation of this region is limited without oral contrast administration. The appendix cannot be clearly identified. The differential diagnosis remains the same, including perforated appendicitis with phlegmon/abscess, versus tubo-ovarian abscess. Vascular/Lymphatic: No evidence of atherosclerosis. There is a dilated patent right gonadal vein with inflammatory changes in the surrounding fat, slightly more pronounced than prior study. Subcentimeter likely reactive lymph nodes are seen in the right lower quadrant  mesentery. No pathologic adenopathy. Reproductive: The uterus is unremarkable. No evidence of left adnexal mass. The right adnexa is obscured by the inflammatory process described above. Other: Trace free fluid within the pelvis and tracking along the right gonadal vein and into the right paracolic  gutter. No free intraperitoneal gas. Musculoskeletal: No acute or destructive bony lesions. Interval development of intramuscular edema within the right lower quadrant abdominal wall overlying the inflammatory process described above. This is nonspecific. Reconstructed images demonstrate no additional findings. IMPRESSION: 1. Slight increase in size in the inflammatory process in the right lower quadrant as above. This could reflect perforated appendicitis with abscess, versus tubo-ovarian abscess. A normal appendix cannot be identified. 2. Interval development of inflammatory changes within the musculature of the right lower quadrant abdominal wall, overlying the inflammatory process described above. This is nonspecific. 3. Trace free fluid within the pelvis and right paracolic gutter. No evidence of pneumoperitoneum. 4. Cholelithiasis without cholecystitis. Electronically Signed   By: Sharlet Salina M.D.   On: 02/05/2021 23:14    Anti-infectives: Anti-infectives (From admission, onward)   Start     Dose/Rate Route Frequency Ordered Stop   02/04/21 1000  cefoTEtan (CEFOTAN) 2 g in sodium chloride 0.9 % 100 mL IVPB       Note to Pharmacy: receiv3d rocephin without issue   2 g 200 mL/hr over 30 Minutes Intravenous Every 12 hours 02/04/21 0451     02/04/21 0600  doxycycline (VIBRAMYCIN) 100 mg in sodium chloride 0.9 % 250 mL IVPB        100 mg 125 mL/hr over 120 Minutes Intravenous Every 12 hours 02/04/21 0451     02/03/21 2145  cefTRIAXone (ROCEPHIN) 2 g in sodium chloride 0.9 % 100 mL IVPB       "And" Linked Group Details   2 g 200 mL/hr over 30 Minutes Intravenous  Once 02/03/21 2137 02/03/21 2315   02/03/21 2145  metroNIDAZOLE (FLAGYL) IVPB 500 mg       "And" Linked Group Details   500 mg 100 mL/hr over 60 Minutes Intravenous  Once 02/03/21 2137 02/04/21 0026       Assessment/Plan Hypothyroidism Anemia Asthma  TOA vs perforated appendicitis with abscess - Continues to improve  clinically. GYN planning to continue IV abx total of 72 hours, transition to oral augmentin/ doxy tomorrow and discharge home if still doing clinically well. Will plan for repeat CT scan 1-2 weeks after discharge, and follow up with Dr. Carolynne Edouard after that to discuss possibility of interval appendectomy. We will sign off, please call with concerns.  ID - rocephin/flagyl 3/12, cefotetan/ doxy 3/13>> FEN - reg diet VTE - SCDs, lovenox Foley - none Follow up - OB/GYN, Dr. Carolynne Edouard   LOS: 2 days    Franne Forts, [facility] 02/06/2021, 8:47 AM Please see Amion for pager number during day hours 7:00am-4:30pm

## 2021-02-06 NOTE — Progress Notes (Signed)
Faculty Note  In to answer questions at patient's request regarding diagnosis, plan of care. Answered all questions from patient and her husband. Plan for transition to PO abx tomorrow.  Baldemar Lenis, MD, [facility] Attending Center for [facility])

## 2021-02-07 DIAGNOSIS — N7003 Acute salpingitis and oophoritis: Secondary | ICD-10-CM

## 2021-02-07 LAB — CBC WITH DIFFERENTIAL/PLATELET
Abs Immature Granulocytes: 0 10*3/uL (ref 0.00–0.07)
Basophils Absolute: 0 10*3/uL (ref 0.0–0.1)
Basophils Relative: 1 %
Eosinophils Absolute: 0.2 10*3/uL (ref 0.0–0.5)
Eosinophils Relative: 4 %
HCT: 25.5 % — ABNORMAL LOW (ref 36.0–46.0)
Hemoglobin: 8.5 g/dL — ABNORMAL LOW (ref 12.0–15.0)
Immature Granulocytes: 0 %
Lymphocytes Relative: 31 %
Lymphs Abs: 1.3 10*3/uL (ref 0.7–4.0)
MCH: 25.3 pg — ABNORMAL LOW (ref 26.0–34.0)
MCHC: 33.3 g/dL (ref 30.0–36.0)
MCV: 75.9 fL — ABNORMAL LOW (ref 80.0–100.0)
Monocytes Absolute: 0.2 10*3/uL (ref 0.1–1.0)
Monocytes Relative: 5 %
Neutro Abs: 2.5 10*3/uL (ref 1.7–7.7)
Neutrophils Relative %: 59 %
Platelets: 274 10*3/uL (ref 150–400)
RBC: 3.36 MIL/uL — ABNORMAL LOW (ref 3.87–5.11)
RDW: 17.1 % — ABNORMAL HIGH (ref 11.5–15.5)
WBC: 4.1 10*3/uL (ref 4.0–10.5)
nRBC: 0 % (ref 0.0–0.2)

## 2021-02-07 MED ORDER — METRONIDAZOLE 500 MG PO TABS: 500.0000 mg | ORAL_TABLET | Freq: Two times a day (BID) | ORAL | 0 refills | Status: AC

## 2021-02-07 MED ORDER — METRONIDAZOLE 500 MG PO TABS
500.0000 mg | ORAL_TABLET | Freq: Two times a day (BID) | ORAL | Status: DC
  Administered 2021-02-07: 500 mg via ORAL
  Filled 2021-02-07: qty 1

## 2021-02-07 MED ORDER — DOXYCYCLINE HYCLATE 100 MG PO TABS
100.0000 mg | ORAL_TABLET | Freq: Two times a day (BID) | ORAL | Status: DC
  Administered 2021-02-07: 100 mg via ORAL
  Filled 2021-02-07: qty 1

## 2021-02-07 MED ORDER — DOXYCYCLINE HYCLATE 100 MG PO TABS: 100.0000 mg | ORAL_TABLET | Freq: Two times a day (BID) | ORAL | 0 refills | Status: AC

## 2021-02-07 MED ORDER — DOXYCYCLINE HYCLATE 100 MG PO TABS: 100.0000 mg | ORAL_TABLET | Freq: Two times a day (BID) | ORAL | Status: DC

## 2021-02-07 NOTE — Discharge Summary (Signed)
Physician Discharge Summary  Patient ID: Tracy Hansen MRN: [id number] DOB/AGE: 1991/07/06 30 y.o.  Admit date: 02/03/2021 Discharge date: 02/07/2021  Admission Diagnoses: tubo-ovarian abscess  Discharge Diagnoses:  Active Problems:   Tubo-ovarian abscess, right   Discharged Condition: good  Hospital Course: Please see HPI dated 02/03/2021 for full details. Briefly, this is a 29 y.o. [id number] female admitted for TOA versus perforated appendicitis, favor TOA. She was started on IV antibiotics and continued for 72 hours. Her hospital course was uncomplicated and her pain decreased over the the course of her stay. Her WBC trended down as well. She was seen by general surgery who would like her to follow up as outpatient.    She was deemed stable for discharge, instructions for follow up given. She will follow up in office in 2 weeks.  Medical history significant for hypothyroidism.  Physical Exam:  BP 121/77 (BP Location: Left Arm)   Pulse 80   Temp 98 F (36.7 C) (Oral)   Resp 18   Ht 5\' 2"  (1.575 m)   Wt 78 kg   LMP 02/02/2021   SpO2 100%   Breastfeeding No   BMI 31.46 kg/m  General: alert, oriented, cooperative Chest: normal respiratory effort Heart: RRR  Abdomen: soft, no tenderness noted  DVT Evaluation: no evidence of DVT Extremities: no edema, no calf tenderness   Labs: Lab Results  Component Value Date   WBC 4.1 02/07/2021   HGB 8.5 (L) 02/07/2021   HCT 25.5 (L) 02/07/2021   MCV 75.9 (L) 02/07/2021   PLT 274 02/07/2021   CMP Latest Ref Rng & Units 02/03/2021  Glucose 70 - 99 mg/dL 86  BUN 6 - 20 mg/dL 10  Creatinine 04/05/2021 - 2.92 mg/dL 4.46  Sodium 2.86 - 381 mmol/L 137  Potassium 3.5 - 5.1 mmol/L 3.7  Chloride 98 - 111 mmol/L 101  CO2 22 - 32 mmol/L 24  Calcium 8.9 - 10.3 mg/dL 9.3  Total Protein 6.5 - 8.1 g/dL 771)  Total Bilirubin 0.3 - 1.2 mg/dL 1.6(F)  Alkaline Phos 38 - 126 U/L 57  AST 15 - 41 U/L 14(L)  ALT 0 - 44 U/L 11       Disposition: Discharge disposition: 01-Home or Self Care       Discharge Instructions    Call MD for:  difficulty breathing, headache or visual disturbances   Complete by: As directed    Call MD for:  extreme fatigue   Complete by: As directed    Call MD for:  hives   Complete by: As directed    Call MD for:  persistant dizziness or light-headedness   Complete by: As directed    Call MD for:  persistant nausea and vomiting   Complete by: As directed    Call MD for:  redness, tenderness, or signs of infection (pain, swelling, redness, odor or green/yellow discharge around incision site)   Complete by: As directed    Call MD for:  severe uncontrolled pain   Complete by: As directed    Call MD for:  temperature >100.4   Complete by: As directed    Diet - low sodium heart healthy   Complete by: As directed    Discharge wound care:   Complete by: As directed    Wash with warm, soapy water. Pat dry, do not scrub.   Increase activity slowly   Complete by: As directed    May shower / Bathe   Complete by: As directed  An After Visit Summary was printed and given to the patient. Allergies as of 02/07/2021      Reactions   Nickel Rash   Suprax [cefixime] Rash      Medication List    TAKE these medications   acetaminophen 500 MG tablet Commonly known as: TYLENOL Take 1,000 mg by mouth every 6 (six) hours as needed for mild pain or headache.   albuterol 108 (90 Base) MCG/ACT inhaler Commonly known as: VENTOLIN HFA Inhale 1 puff into the lungs every 6 (six) hours as needed for wheezing or shortness of breath.   budesonide-formoterol 80-4.5 MCG/ACT inhaler Commonly known as: SYMBICORT Inhale 1 puff into the lungs 2 (two) times daily as needed (For shortness of breath).   docusate sodium 100 MG capsule Commonly known as: COLACE Take 100 mg by mouth daily as needed for mild constipation.   doxycycline 100 MG tablet Commonly known as: VIBRA-TABS Take 1 tablet  (100 mg total) by mouth every 12 (twelve) hours. Start taking on: February 08, 2021   ferrous sulfate 325 (65 FE) MG tablet Take 325 mg by mouth daily with breakfast.   levothyroxine 88 MCG tablet Commonly known as: SYNTHROID Take 88 mcg by mouth daily before breakfast.   metroNIDAZOLE 500 MG tablet Commonly known as: FLAGYL Take 1 tablet (500 mg total) by mouth every 12 (twelve) hours.   Simethicone 125 MG Tabs Take 1 tablet by mouth daily as needed (For gas.).            Discharge Care Instructions  (From admission, onward)         Start     Ordered   02/07/21 0000  Discharge wound care:       Comments: Wash with warm, soapy water. Pat dry, do not scrub.   02/07/21 1559          Follow-up Information    Griselda Miner, MD. Call.   Specialty: General Surgery Why: We are working on your appointment, call to confirm. You will have a repeat CT scan prior to this appointment. Please arrive 30 minutes prior to your appointment to check in and fill out paperwork. Bring photo ID and insurance information. Contact information: [address] [place] [address] [phone]        [facility]. Go in 2 week(s).   Specialty: Obstetrics and Gynecology Contact information: [address] [place] [address] [phone]              Signed: Conan Bowens 02/07/2021, 4:00 PM

## 2021-02-07 NOTE — Progress Notes (Signed)
    Gynecology Progress Note  Admission Date: 02/03/2021 Current Date: 02/07/2021 9:01 AM  Tracy Hansen is a 30 y.o. [id number] HD#4 admitted for management of tubo-ovarian abscess.    History complicated by: Patient Active Problem List   Diagnosis Date Noted  . Tubo-ovarian abscess, right 02/04/2021    Subjective:  She is feeling better, pain is minimal. Eating, ambulating, using restroom without issue. Overall, feeling much better than on arrival.   Objective:   Vitals:   02/06/21 2329 02/07/21 0443 02/07/21 0609 02/07/21 0746  BP: 105/73 102/62  (!) 109/59  Pulse: 78 72  75  Resp: 17 17  18   Temp: 98.7 F (37.1 C) 98.1 F (36.7 C)  98.3 F (36.8 C)  TempSrc: Oral Oral  Oral  SpO2: 100% 100%  100%  Weight:   78 kg   Height:        Total I/O In: 250 [IV Piggyback:250] Out: -   Intake/Output Summary (Last 24 hours) at 02/07/2021 0901 Last data filed at 02/07/2021 0739 Gross per 24 hour  Intake 1106.52 ml  Output 1050 ml  Net 56.52 ml     Physical exam: BP (!) 109/59 (BP Location: Left Arm)   Pulse 75   Temp 98.3 F (36.8 C) (Oral)   Resp 18   Ht 5\' 2"  (1.575 m)   Wt 78 kg   LMP 02/02/2021   SpO2 100%   Breastfeeding No   BMI 31.46 kg/m  CONSTITUTIONAL: Well-developed, well-nourished female in no acute distress.  HENT:  Normocephalic, atraumatic, External right and left ear normal. Oropharynx is clear and moist EYES: Conjunctivae and EOM are normal. Pupils are equal, round, and reactive to light. No scleral icterus.  NECK: Normal range of motion, supple, no masses.  Normal thyroid.  SKIN: Skin is warm and dry. No rash noted. Not diaphoretic. No erythema. No pallor. NEUROLOGIC: Alert and oriented to person, place, and time. Normal reflexes, muscle tone coordination. No cranial nerve deficit noted. PSYCHIATRIC: Normal mood and affect. Normal behavior. Normal judgment and thought content. CARDIOVASCULAR: Normal heart rate noted RESPIRATORY: Effort normal, no  problems with respiration noted. ABDOMEN: Soft, no distention noted. No tenderness noted with palpation, no rebound or guarding.  PELVIC: deferred MUSCULOSKELETAL: Normal range of motion. No tenderness.  No cyanosis, clubbing, or edema.    UOP: voiding spontaneously  Labs  Recent Labs  Lab 02/03/21 1837 02/04/21 0523 02/07/21 0514  WBC 8.9 7.7 4.1  HGB 11.3* 10.0* 8.5*  HCT 36.2 30.6* 25.5*  PLT 295 263 274     Assessment & Plan:   Patient is 30 y.o. 02/09/21 HD#4 admitted for tubo-ovarian abscess. Has improved on 72 hr IV antibiotics and was transitioned to PO antibiotics this am. WBC has trended down from 8.9 to 4.1. She has remained afebrile. She is doing much better this am and feeling ready for discharge.   Transition to PO antibiotics Likely home this afternoon once tolerating PO antibiotics   K. 37, MD, [facility] Attending [facility])

## 2021-02-07 NOTE — Progress Notes (Signed)
Pt discharged after discharge paperwork given. Pt verbalized understanding. All questions answered. IV discontinued and pt sent with all belongings.

## 2021-02-09 ENCOUNTER — Other Ambulatory Visit (HOSPITAL_COMMUNITY): Payer: Self-pay | Admitting: General Surgery

## 2021-02-09 DIAGNOSIS — K37 Unspecified appendicitis: Secondary | ICD-10-CM

## 2021-02-14 ENCOUNTER — Other Ambulatory Visit: Payer: Self-pay

## 2021-02-14 ENCOUNTER — Encounter (HOSPITAL_COMMUNITY): Payer: Self-pay

## 2021-02-14 ENCOUNTER — Ambulatory Visit (HOSPITAL_COMMUNITY)
Admission: RE | Admit: 2021-02-14 | Discharge: 2021-02-14 | Disposition: A | Discharge status: Home / Self Care | Payer: 59 | Source: Ambulatory Visit | Attending: General Surgery | Admitting: General Surgery

## 2021-02-14 DIAGNOSIS — K37 Unspecified appendicitis: Secondary | ICD-10-CM | POA: Diagnosis not present

## 2021-02-14 IMAGING — CT CT ABD-PELV W/ CM
2 of 4 series · 15 of 46 positions shown, 17 images · IV contrast (APPLIED)
Comparison: CT abdomen pelvis dated [DATE].

CLINICAL DATA: 29-year-old female with lower abdominal pain.

EXAM:
CT ABDOMEN AND PELVIS WITH CONTRAST
TECHNIQUE: Multidetector CT imaging of the abdomen and pelvis was performed
using the standard protocol following bolus administration of
intravenous contrast.
CONTRAST:  100mL OMNIPAQUE IOHEXOL 300 MG/ML  SOLN

[Series 2: axial st · axial · 0.86mm/px · z∈[-474,-89]mm · 12 of 87 slices shown, 14 images]
[im 5/87  soft-tissue]
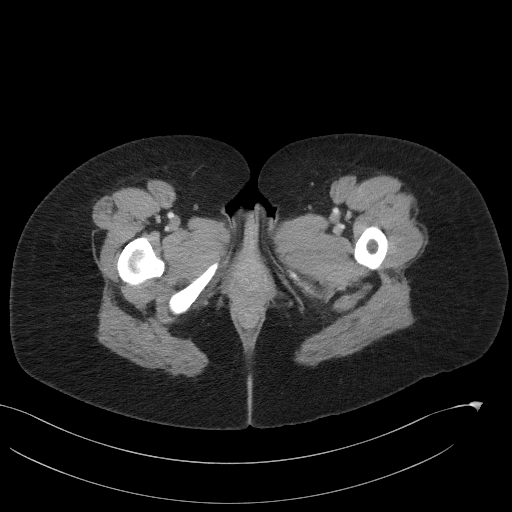
[im 5/87  bone]
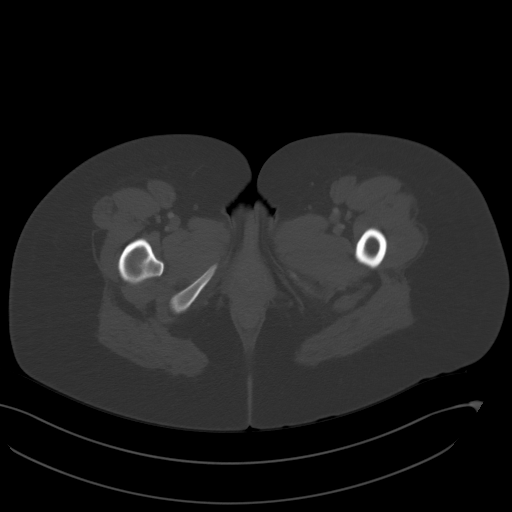
[im 14/87  soft-tissue]
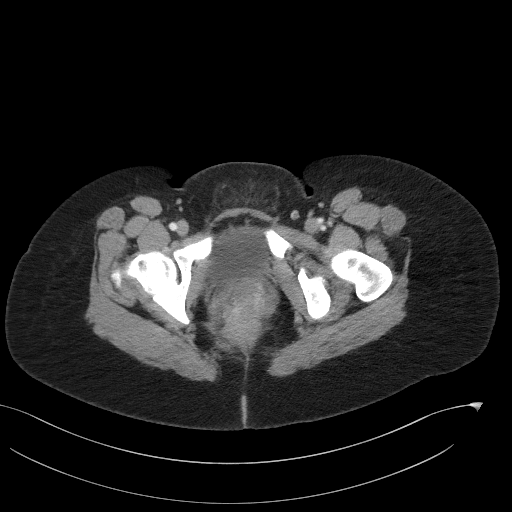
[im 19/87  soft-tissue]
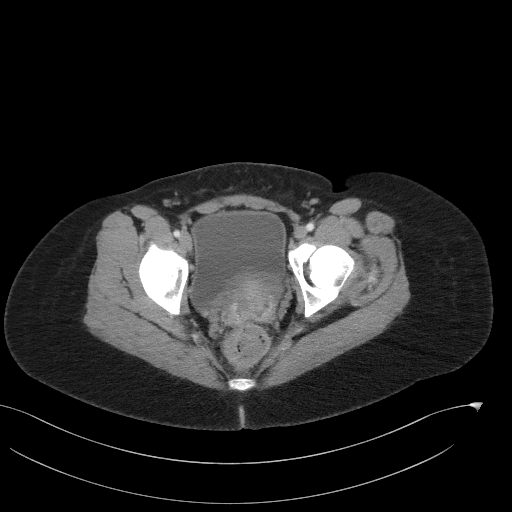
[im 28/87  soft-tissue]
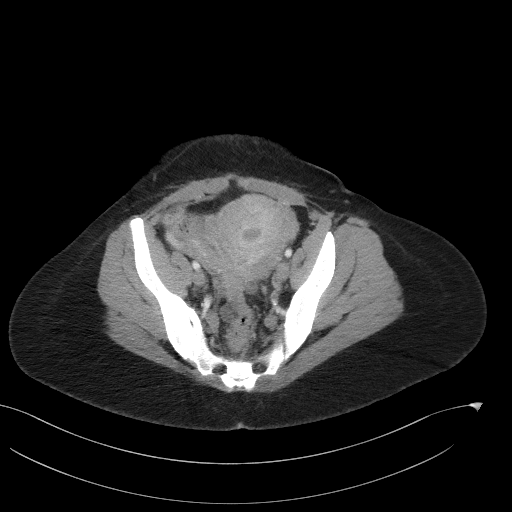
[im 32/87  soft-tissue]
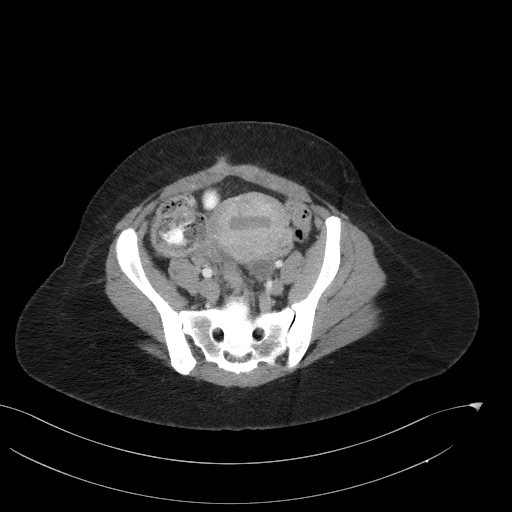
[im 41/87  soft-tissue]
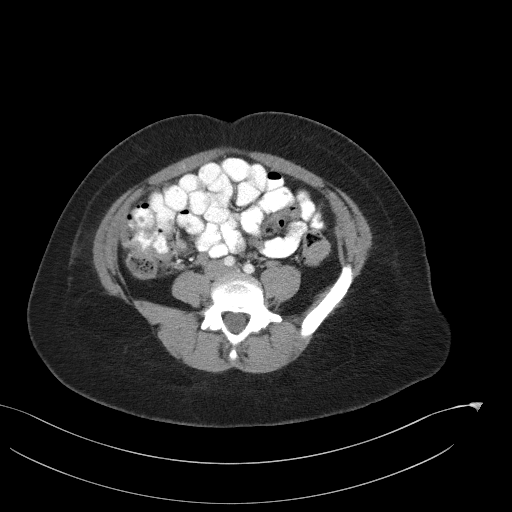
[im 46/87  soft-tissue]
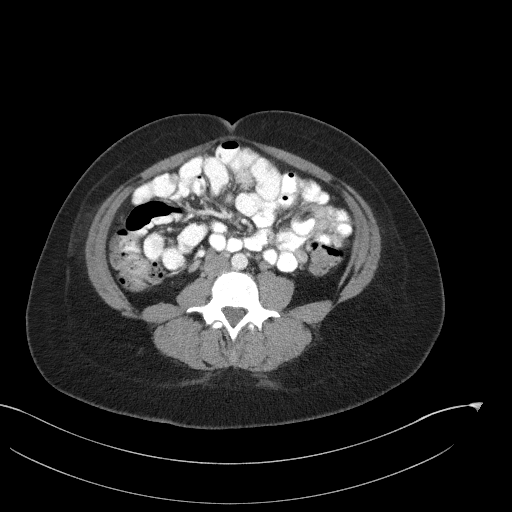
[im 55/87  soft-tissue]
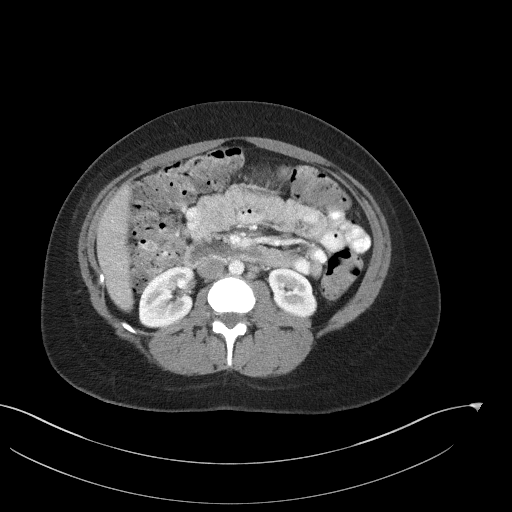
[im 59/87  soft-tissue]
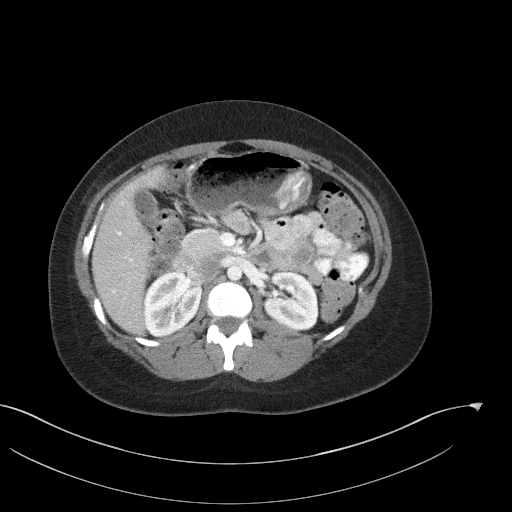
[im 59/87  bone]
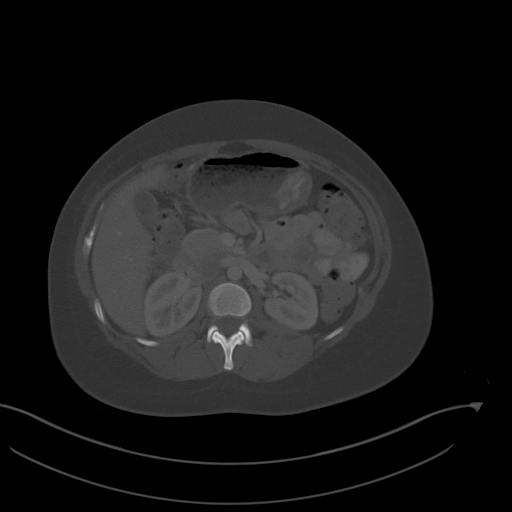
[im 68/87  soft-tissue]
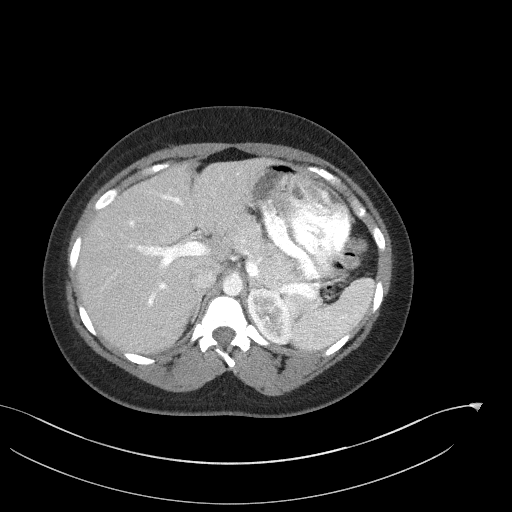
[im 73/87  soft-tissue]
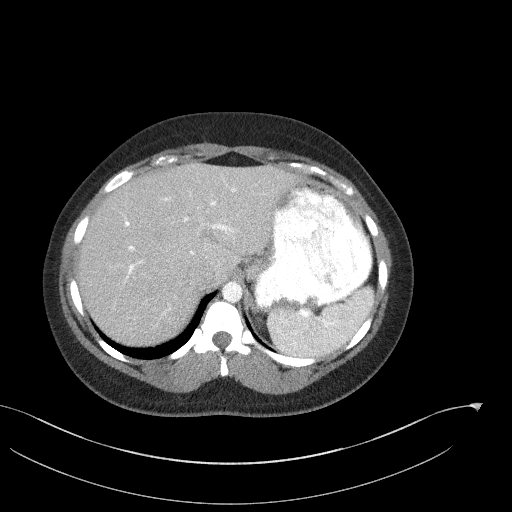
[im 82/87  soft-tissue]
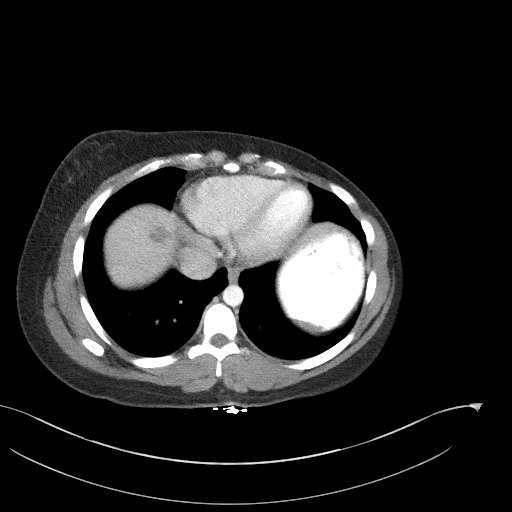

[Series 5: coronal st · coronal · 0.68mm/px · 3 of 95 slices shown]
[im 32/95  soft-tissue]
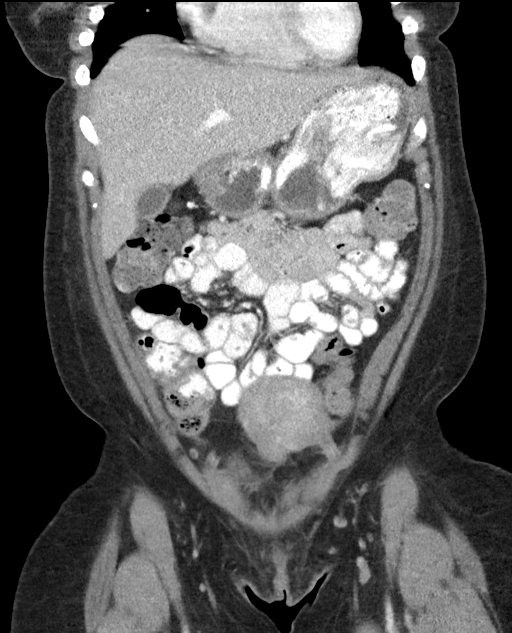
[im 42/95  soft-tissue]
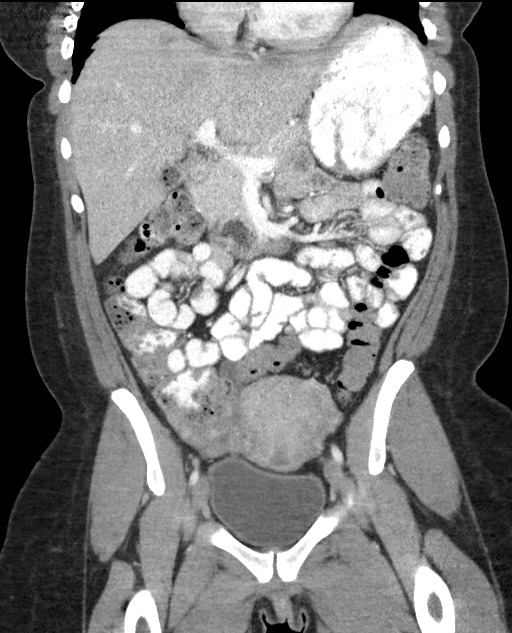
[im 53/95  soft-tissue]
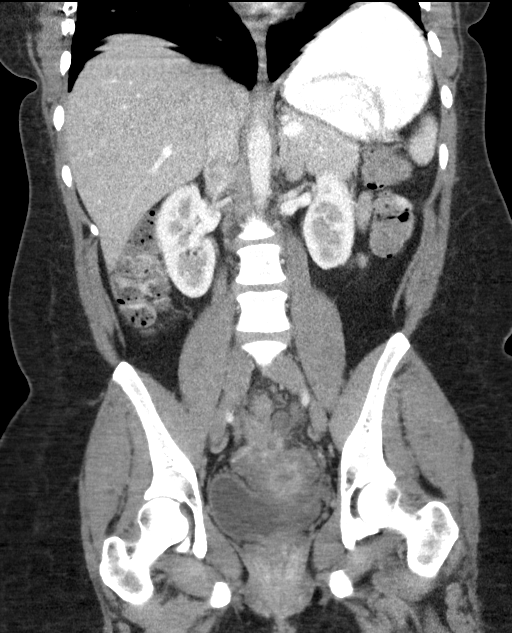

[15 of 46 positions shown; findings below may reference images not displayed]

FINDINGS: Lower chest: Visualized lung bases are clear.

No intra-abdominal free air or free fluid.

Hepatobiliary: Indeterminate 12 mm hypodense lesion with central
enhancement in the dome of the liver. This likely represents a
benign process, such as adenoma or possibly FNH, given patient's age
but the enhancement pattern is not typical of hemangioma. Further
evaluation with MRI without and with contrast on a
nonemergent/outpatient basis recommended. The liver is otherwise
unremarkable. Multiple gallstones. No pericholecystic fluid or
evidence of acute cholecystitis by CT.

Pancreas: Unremarkable. No pancreatic ductal dilatation or
surrounding inflammatory changes.

Spleen: Normal in size without focal abnormality.

Adrenals/Urinary Tract: The adrenal glands unremarkable. The
kidneys, visualized ureters, and urinary bladder appear
unremarkable.

Stomach/Bowel: There is moderate stool throughout the colon. There
is no bowel obstruction or active inflammation. Significant interval
improvement in the inflammatory changes of the right lower quadrant
compared to prior CT. No drainable fluid collection or abscess
identified. The appendix appears more conspicuous. The visualized
proximal appendix is unremarkable. The distal and tip of the
appendix extending into the posterior pelvis and appears somewhat
thickened with ill-defined margins.

Vascular/Lymphatic: The abdominal aorta and IVC unremarkable. No
portal venous gas. There is no adenopathy.

Reproductive: The uterus is anteverted. The left ovary is
unremarkable. The right ovary is poorly visualized due to volume
averaging with surrounding soft tissues and inflammatory changes.

Other: Anterior pelvic wall C-section scar.

Musculoskeletal: No acute or significant osseous findings.
IMPRESSION: 1. Significant interval improvement in the inflammatory changes of
the right lower quadrant compared to prior CT. No drainable fluid
collection or abscess identified.
2. Cholelithiasis.
3. Indeterminate 12 mm hypodense lesion in the dome of the liver,
likely a benign process. Further evaluation with MRI without and
with contrast on a nonemergent/outpatient basis recommended.

## 2021-02-14 MED ORDER — IOHEXOL 300 MG/ML  SOLN
100.0000 mL | Freq: Once | INTRAMUSCULAR | Status: AC | PRN
  Administered 2021-02-14: 100 mL via INTRAVENOUS

## 2021-03-14 ENCOUNTER — Ambulatory Visit: Payer: 59 | Admitting: Obstetrics & Gynecology

## 2021-03-18 NOTE — Addendum Note (Signed)
Encounter addended by: Novella Olive on: 03/18/2021 1:54 PM  Actions taken: Letter saved

## 2021-03-22 ENCOUNTER — Other Ambulatory Visit: Payer: Self-pay | Admitting: General Surgery

## 2021-03-22 DIAGNOSIS — R160 Hepatomegaly, not elsewhere classified: Secondary | ICD-10-CM

## 2021-04-05 ENCOUNTER — Other Ambulatory Visit: Payer: 59

## 2021-04-09 ENCOUNTER — Ambulatory Visit
Admission: RE | Admit: 2021-04-09 | Discharge: 2021-04-09 | Disposition: A | Discharge status: Home / Self Care | Payer: 59 | Source: Ambulatory Visit | Attending: General Surgery | Admitting: General Surgery

## 2021-04-09 ENCOUNTER — Ambulatory Visit: Payer: 59 | Admitting: Obstetrics & Gynecology

## 2021-04-09 ENCOUNTER — Other Ambulatory Visit: Payer: Self-pay

## 2021-04-09 DIAGNOSIS — R160 Hepatomegaly, not elsewhere classified: Secondary | ICD-10-CM

## 2021-04-09 IMAGING — MR MR ABDOMEN WO/W CM
11 of 17 series · 28 of 48 positions shown · IV contrast (multihance)
Comparison: [DATE] CT.

CLINICAL DATA: Hepatic dome liver lesion on CT. No history of
primary malignancy.

EXAM:
MRI ABDOMEN WITHOUT AND WITH CONTRAST
TECHNIQUE: Multiplanar multisequence MR imaging of the abdomen was performed
both before and after the administration of intravenous contrast.
CONTRAST:  13mL MULTIHANCE GADOBENATE DIMEGLUMINE 529 MG/ML IV SOLN

[Series 3: cor haste · coronal · 5.0mm · 0.68mm/px · 2 of 32 slices shown]
[im 1/32]
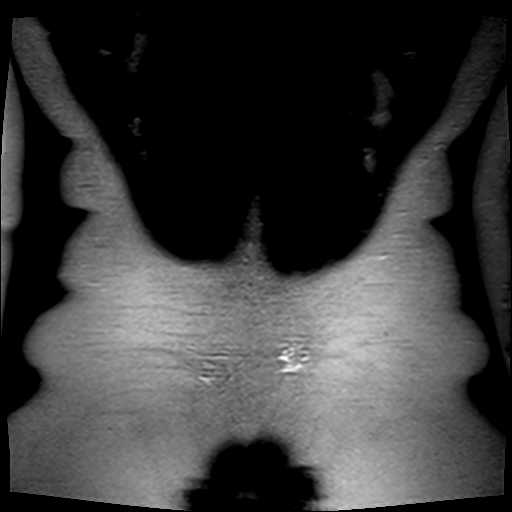
[im 32/32]
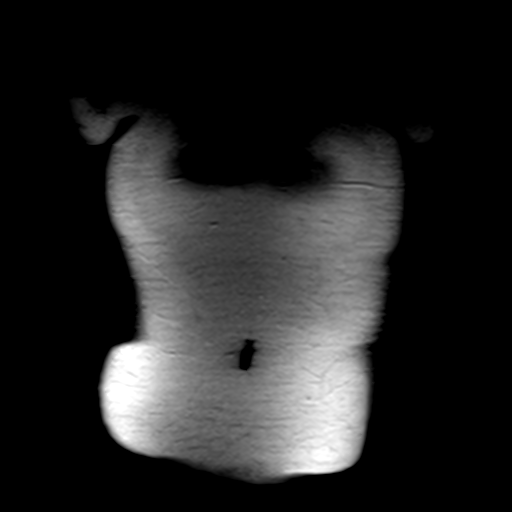

[Series 4: axial haste · axial · 6.0mm · 0.68mm/px · z∈[-119,+92]mm · 2 of 33 slices shown]
[im 1/33]
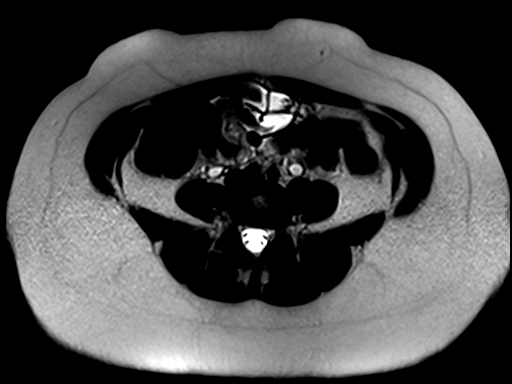
[im 33/33]
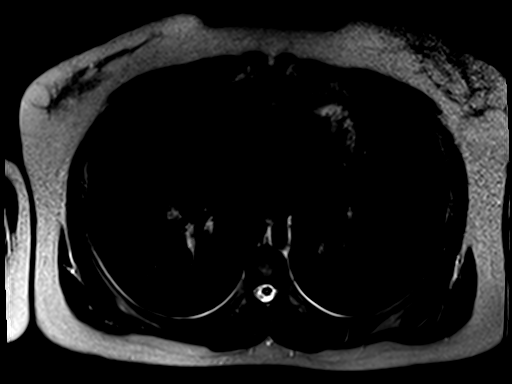

[Series 5: T1 · axial · 6.0mm · 0.68mm/px · z∈[-119,+92]mm · 4 of 66 slices shown]
[im 1/66]
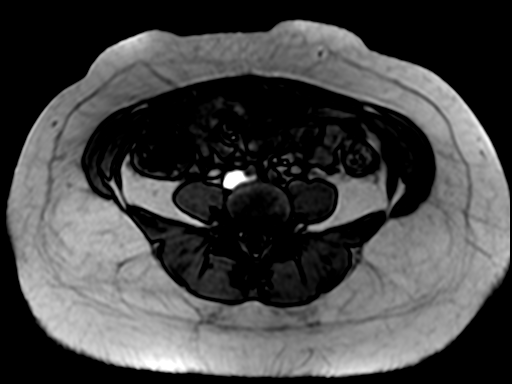
[im 22/66]
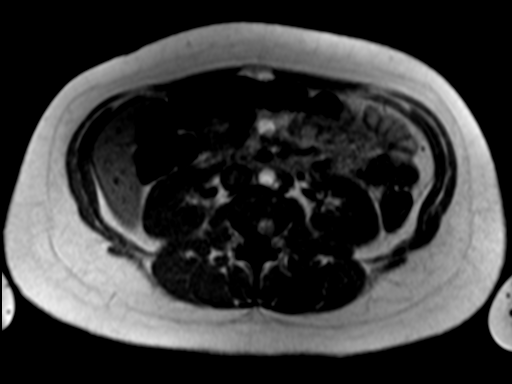
[im 44/66]
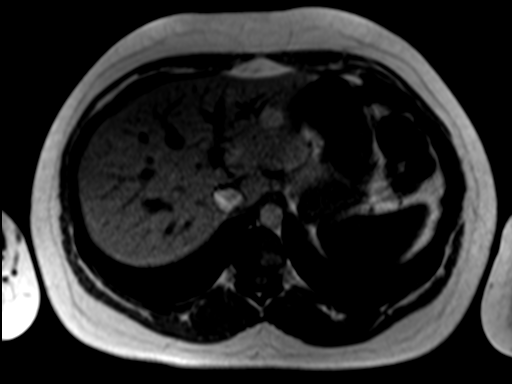
[im 66/66]
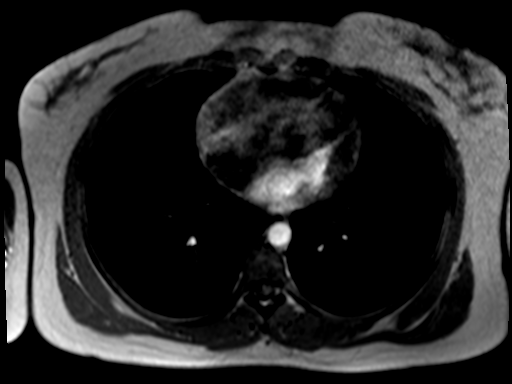

[Series 6: bSSFP · axial · 4.0mm · 0.68mm/px · z∈[-108,+132]mm · 3 of 61 slices shown]
[im 1/61]
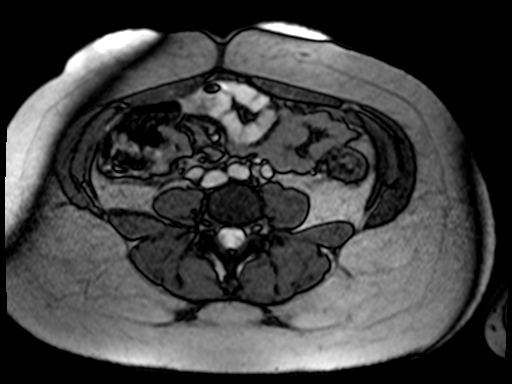
[im 31/61]
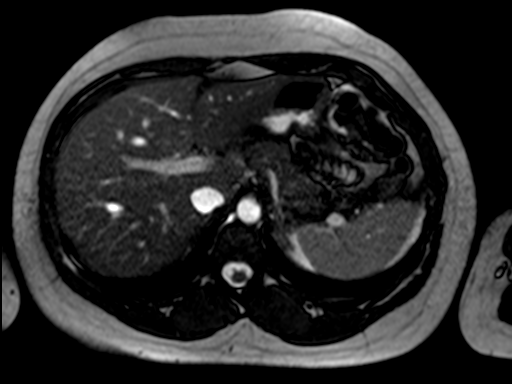
[im 61/61]
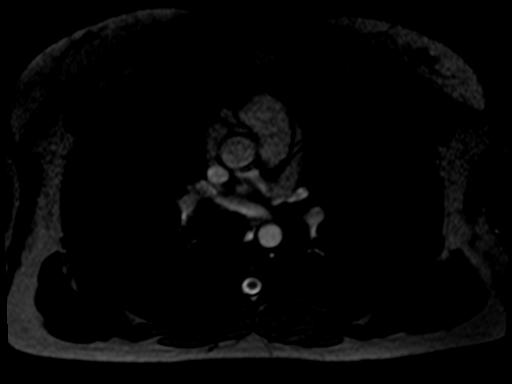

[Series 7: T2 fat-sat · axial · 6.0mm · 1.09mm/px · z∈[-91,+132]mm · 2 of 32 slices shown]
[im 1/32]
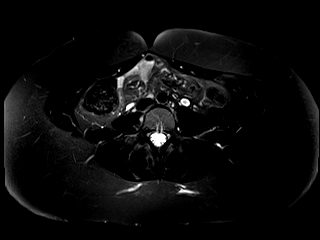
[im 32/32]
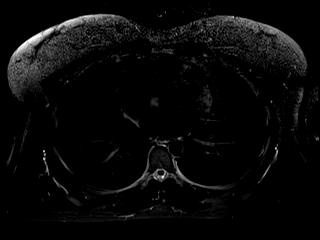

[Series 8: ep2d_diff_b50_500_800_p2_trig · axial · 6.0mm · 1.82mm/px · z∈[-91,+132]mm · 4 of 96 slices shown]
[im 1/96]
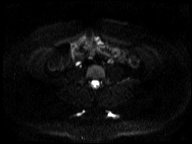
[im 32/96]
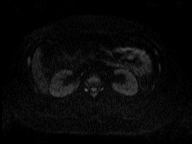
[im 64/96]
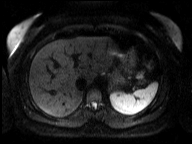
[im 96/96]
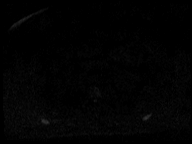

[Series 9: ep2d_diff_b50_500_800_p2_trig_adc · axial · 6.0mm · 1.82mm/px · 1 of 32 slices shown]
[im 1/32]
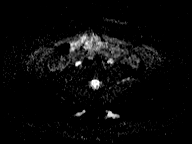

[Series 10: T1 dynamic · axial · non-contrast · 2.5mm · 0.74mm/px · z∈[-123,+94]mm · 3 of 88 slices shown]
[im 1/88]
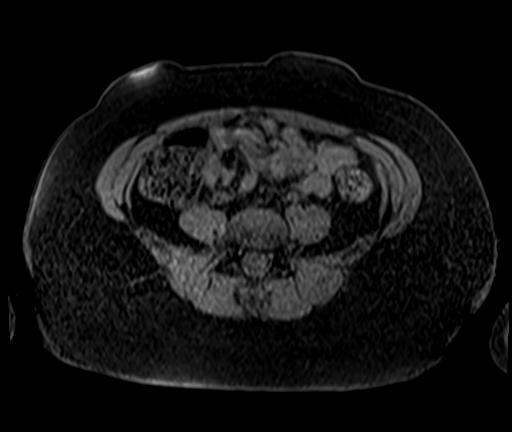
[im 44/88]
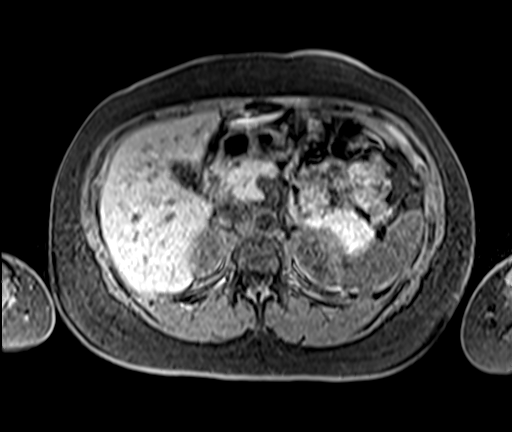
[im 88/88]
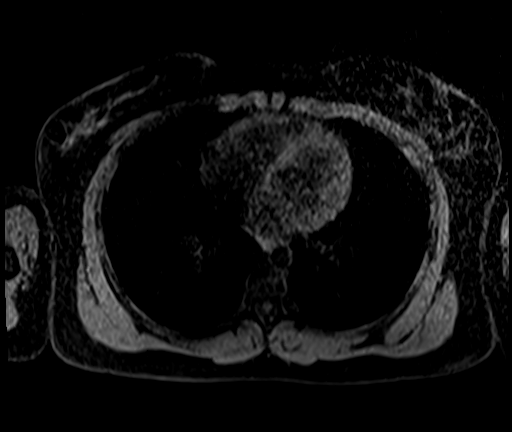

[Series 11: T1 dynamic post-contrast · axial · 2.5mm · 0.74mm/px · z∈[-123,+94]mm · 3 of 88 slices shown (1 of 3)]
[im 1/88]
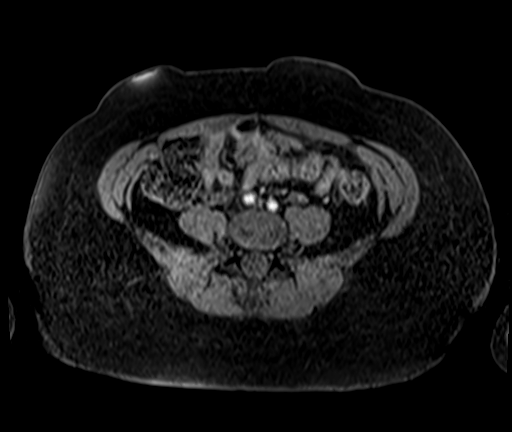
[im 44/88]
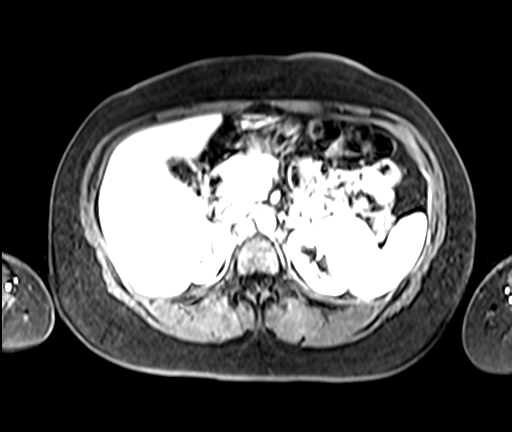
[im 88/88]
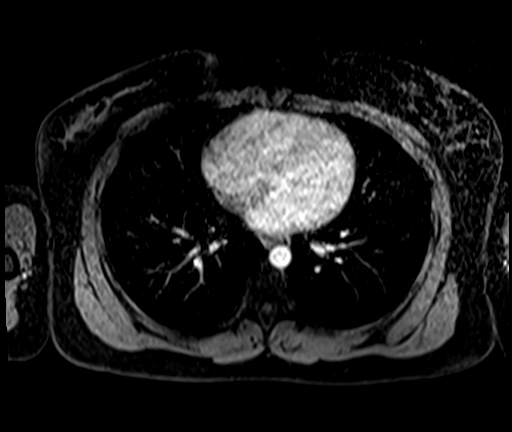

[Series 12: T1 dynamic post-contrast · axial · 2.5mm · 0.74mm/px · z∈[-123,+94]mm · 3 of 88 slices shown (2 of 3)]
[im 1/88]
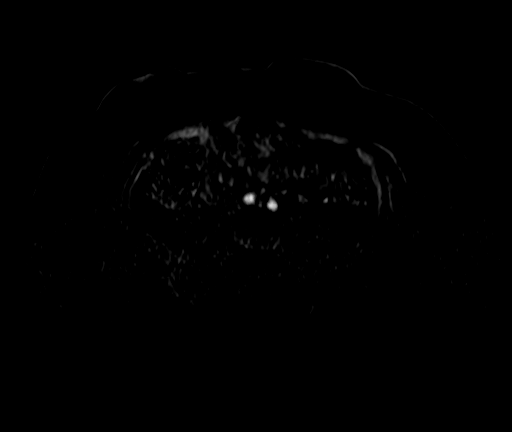
[im 44/88]
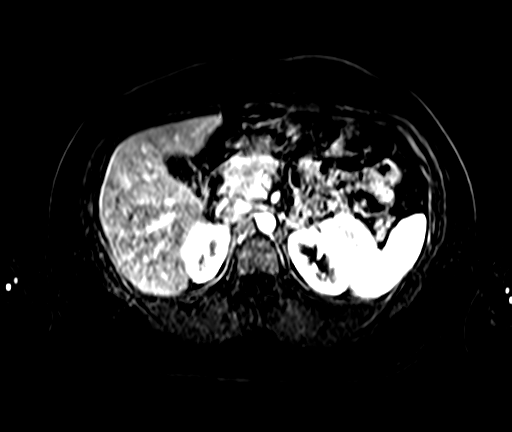
[im 88/88]
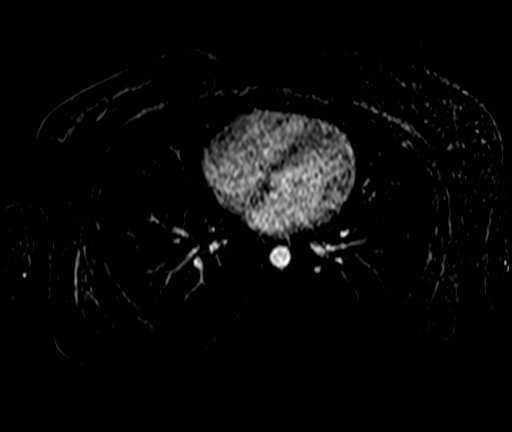

[Series 13: T1 dynamic post-contrast · axial · 2.5mm · 0.74mm/px · 1 of 88 slices shown (3 of 3)]
[im 1/88]
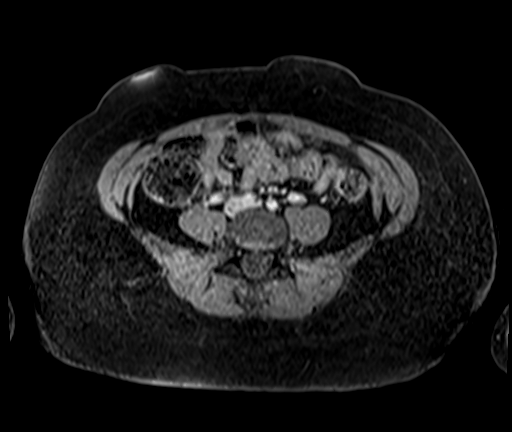

[28 of 48 positions shown; findings below may reference images not displayed]

FINDINGS: Lower chest: Normal heart size without pericardial or pleural
effusion.

Hepatobiliary: No cirrhosis. Hepatic dome (segment 8) T2
hyperintense lesion measures 1.4 cm on [DATE] and demonstrates
peripheral nodular enhancement, including on 15/18.

No suspicious liver lesion. Multiple tiny gallstones without acute
cholecystitis or biliary duct dilatation.

Pancreas:  Normal, without mass or ductal dilatation.

Spleen:  Normal in size, without focal abnormality.

Adrenals/Urinary Tract: Normal adrenal glands. Normal kidneys,
without hydronephrosis.

Stomach/Bowel: Normal stomach and small bowel. Colonic stool burden
suggests constipation.

Vascular/Lymphatic: Normal caliber of the aorta and branch vessels.
No retroperitoneal or retrocrural adenopathy.

Other:  No ascites.

Musculoskeletal: No acute osseous abnormality.
IMPRESSION: 1. The hepatic dome lesion represents a hemangioma.
2. Cholelithiasis without cholecystitis.
3.  Possible constipation.

## 2021-04-09 MED ORDER — GADOBENATE DIMEGLUMINE 529 MG/ML IV SOLN
13.0000 mL | Freq: Once | INTRAVENOUS | Status: AC | PRN
  Administered 2021-04-09: 13 mL via INTRAVENOUS

## 2021-04-17 ENCOUNTER — Other Ambulatory Visit: Payer: 59

## 2021-06-06 ENCOUNTER — Other Ambulatory Visit: Payer: Self-pay | Admitting: Physician Assistant

## 2021-06-06 DIAGNOSIS — R51 Headache with orthostatic component, not elsewhere classified: Secondary | ICD-10-CM

## 2021-06-19 ENCOUNTER — Other Ambulatory Visit: Payer: Self-pay

## 2021-06-19 ENCOUNTER — Ambulatory Visit
Admission: RE | Admit: 2021-06-19 | Discharge: 2021-06-19 | Disposition: A | Discharge status: Home / Self Care | Payer: 59 | Source: Ambulatory Visit | Attending: Physician Assistant | Admitting: Physician Assistant

## 2021-06-19 DIAGNOSIS — R51 Headache with orthostatic component, not elsewhere classified: Secondary | ICD-10-CM

## 2021-06-19 IMAGING — MR MR HEAD WO/W CM
12 series · 48 of 48 positions shown · IV contrast (multihance)
Comparison: No pertinent prior exams available for comparison.

CLINICAL DATA: Positional headache [25] ([25]-CM). Additional
history provided by scanning technologist: Patient reports frontal
headaches for months, headache with turning head.

EXAM:
MRI HEAD WITHOUT AND WITH CONTRAST
TECHNIQUE: Multiplanar, multiecho pulse sequences of the brain and surrounding
structures were obtained without and with intravenous contrast.
CONTRAST:  15mL MULTIHANCE GADOBENATE DIMEGLUMINE 529 MG/ML IV SOLN

[Series 2: T1 · sagittal · 5.0mm · 0.45mm/px · 2 of 25 slices shown]
[im 1/25]
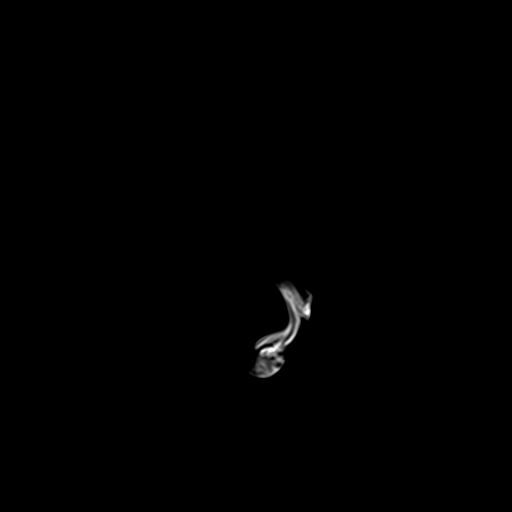
[im 25/25]
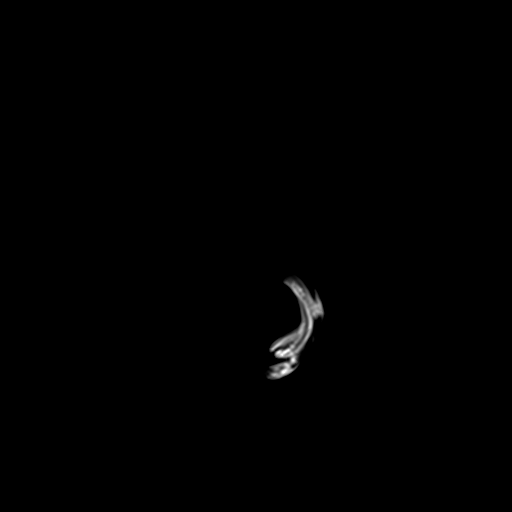

[Series 3: DWI · axial · 3.0mm · 1.80mm/px · z∈[-67,+82]mm · 7 of 106 slices shown (1 of 4)]
[im 1/106]
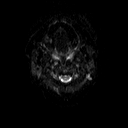
[im 18/106]
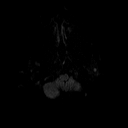
[im 36/106]
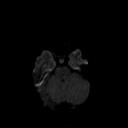
[im 53/106]
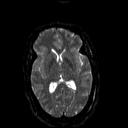
[im 71/106]
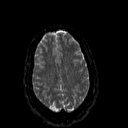
[im 88/106]
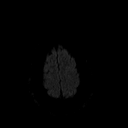
[im 106/106]
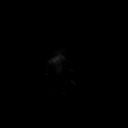

[Series 4: DWI · axial · 3.0mm · 1.80mm/px · z∈[-67,+82]mm · 3 of 53 slices shown (2 of 4)]
[im 1/53]
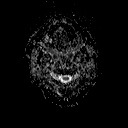
[im 27/53]
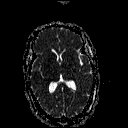
[im 53/53]
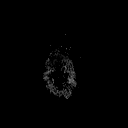

[Series 5: DWI · coronal · 5.0mm · 1.80mm/px · 5 of 76 slices shown (3 of 4)]
[im 1/76]
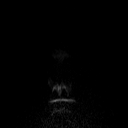
[im 19/76]
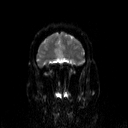
[im 38/76]
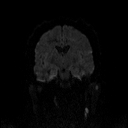
[im 57/76]
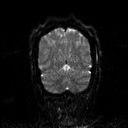
[im 76/76]
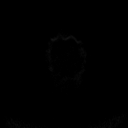

[Series 6: DWI · coronal · 5.0mm · 1.80mm/px · 2 of 38 slices shown (4 of 4)]
[im 1/38]
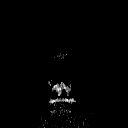
[im 38/38]
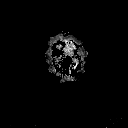

[Series 7: T2 · axial · 5.0mm · 0.60mm/px · z∈[-72,+75]mm · 2 of 24 slices shown (1 of 2)]
[im 1/24]
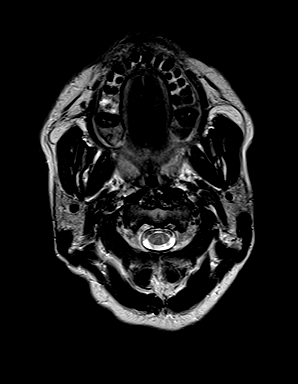
[im 24/24]
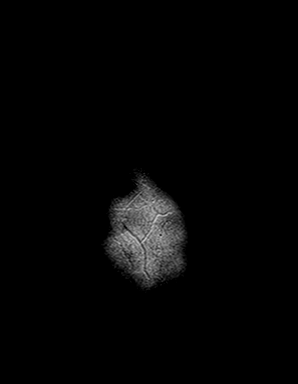

[Series 8: FLAIR · axial · 3.0mm · 0.45mm/px · z∈[-68,+83]mm · 2 of 35 slices shown]
[im 1/35]
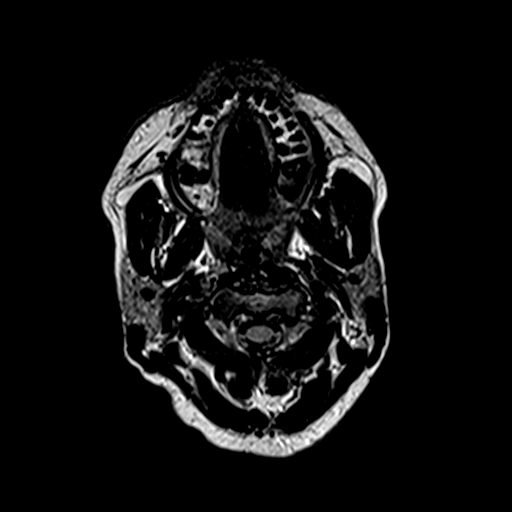
[im 35/35]
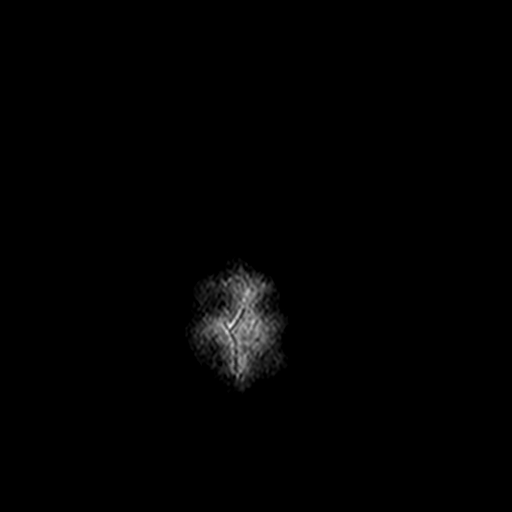

[Series 10: swi_images · axial · 4.0mm · 0.90mm/px · z∈[-68,+80]mm · 3 of 40 slices shown]
[im 1/40]
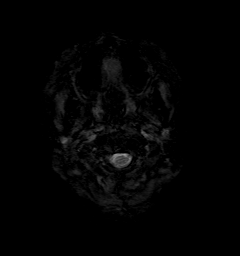
[im 20/40]
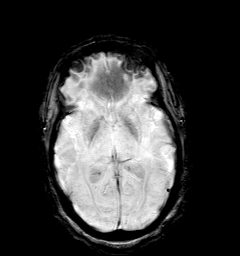
[im 40/40]
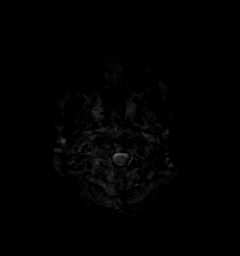

[Series 11: t1_mpr_tra · axial · 1.0mm · 0.75mm/px · z∈[-60,+75]mm · 9 of 144 slices shown]
[im 1/144]
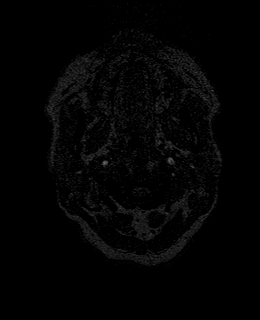
[im 18/144]
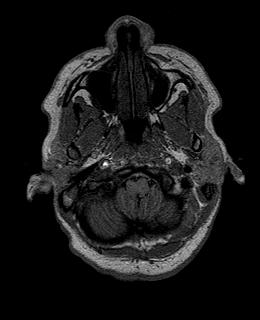
[im 36/144]
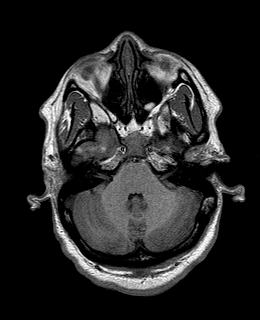
[im 54/144]
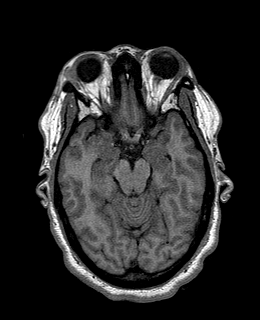
[im 72/144]
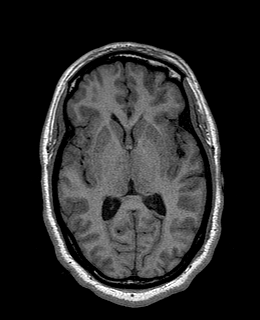
[im 90/144]
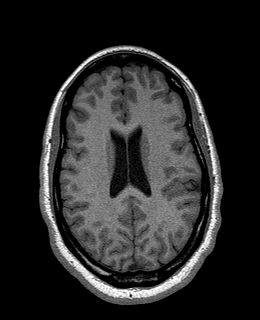
[im 108/144]
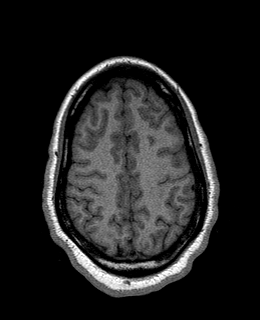
[im 126/144]
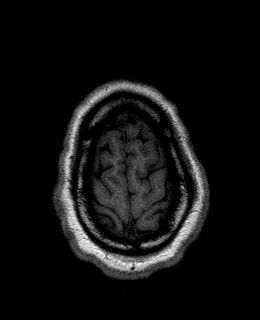
[im 144/144]
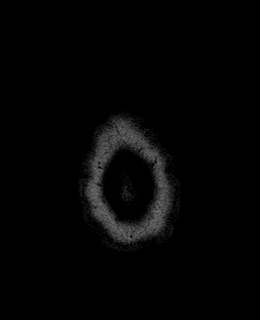

[Series 12: T2 · coronal · 5.0mm · 0.45mm/px · 2 of 30 slices shown (2 of 2)]
[im 1/30]
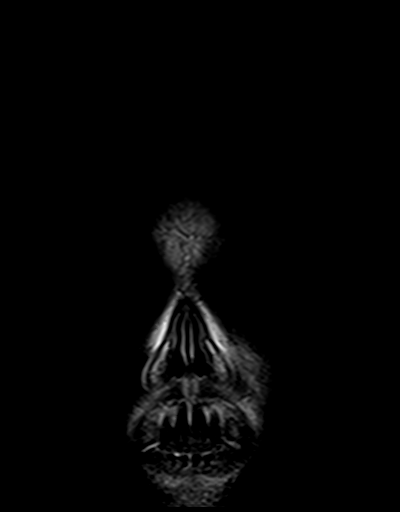
[im 30/30]
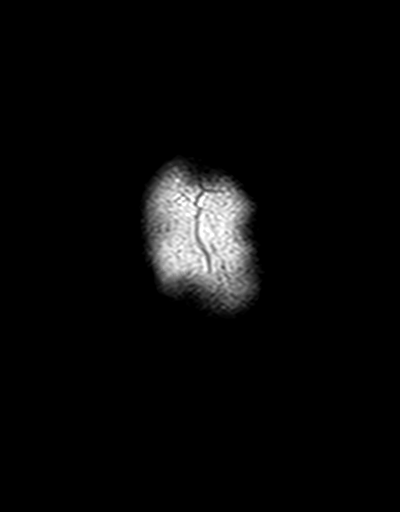

[Series 13: t1_mpr_tra post · axial · 1.0mm · 0.75mm/px · z∈[-60,+75]mm · 9 of 144 slices shown]
[im 1/144]
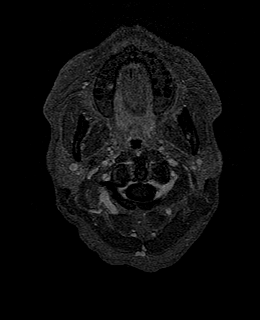
[im 18/144]
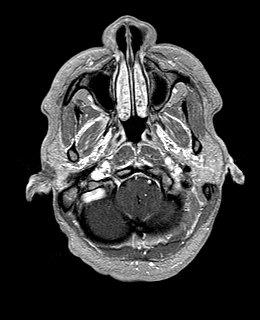
[im 36/144]
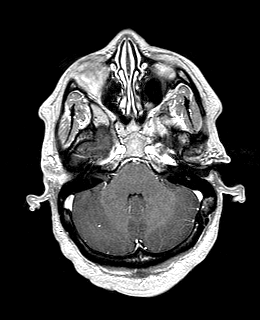
[im 54/144]
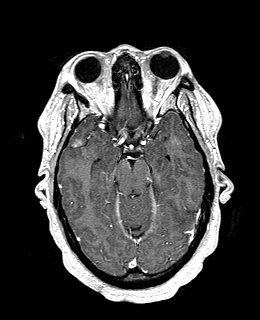
[im 72/144]
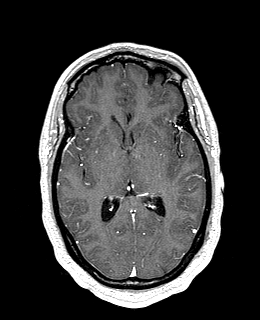
[im 90/144]
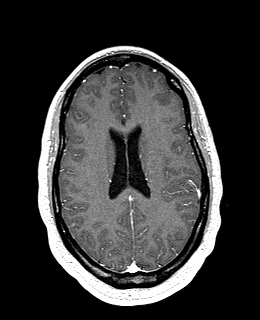
[im 108/144]
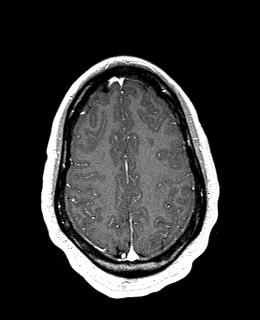
[im 126/144]
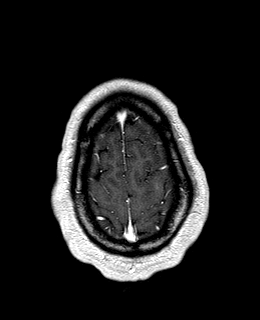
[im 144/144]
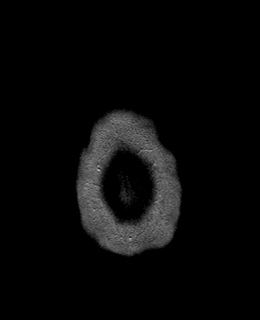

[Series 14: post cor · coronal · 5.0mm · 0.45mm/px · 2 of 30 slices shown]
[im 1/30]
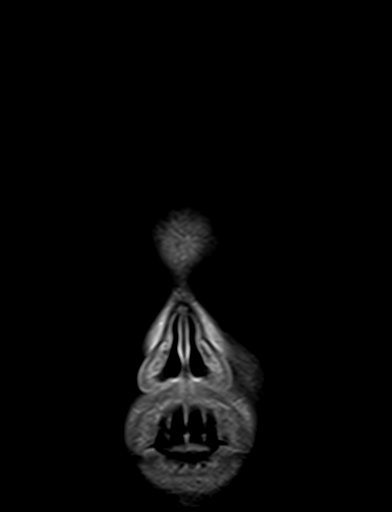
[im 30/30]
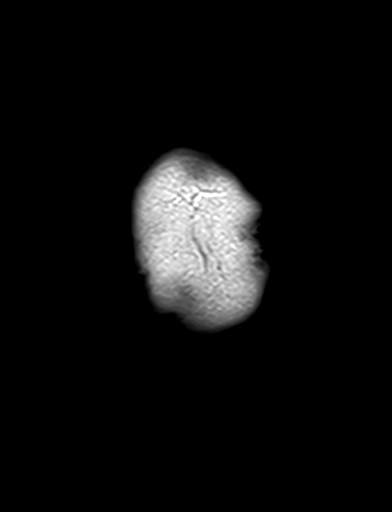

[48 of 48 positions shown; findings below may reference images not displayed]

FINDINGS: Brain:

Cerebral volume is normal.

Chiari 1 malformation. The cerebellar tonsils extend 7 mm below the
level of the foramen magnum. Associated crowding at the level of the
foramen magnum.

Mild patchy T2/FLAIR hyperintensity within the bilateral periatrial
white matter.

No cortical encephalomalacia is identified.

There is no acute infarct.

No evidence of an intracranial mass.

No chronic intracranial blood products.

No extra-axial fluid collection.

No midline shift or hydrocephalus.

No abnormal intracranial enhancement.

Vascular: Expected proximal arterial flow voids. Small developmental
venous anomaly within the right cerebellar hemisphere (an incidental
anatomic variant).

Skull and upper cervical spine: No focal suspicious marrow lesion.

Sinuses/Orbits: Visualized orbits show no acute finding. No
significant paranasal sinus disease.

Impressioin #1 will be called to the ordering clinician or
representative by the Radiologist Assistant, and communication
documented in the PACS or [REDACTED].
IMPRESSION: Chiari 1 malformation, as described. Associated crowding at the
level of the foramen magnum. MR imaging of the spine should be
considered to exclude an associated syrinx.

Mild patchy T2/FLAIR hyperintense signal changes within the
bilateral periatrial white matter, compatible with sequela of a
nonspecific remote insult.

## 2021-06-19 MED ORDER — GADOBENATE DIMEGLUMINE 529 MG/ML IV SOLN
15.0000 mL | Freq: Once | INTRAVENOUS | Status: AC | PRN
  Administered 2021-06-19: 15 mL via INTRAVENOUS

## 2021-06-22 ENCOUNTER — Other Ambulatory Visit: Payer: Self-pay | Admitting: Physician Assistant

## 2021-06-22 DIAGNOSIS — G935 Compression of brain: Secondary | ICD-10-CM

## 2021-06-24 ENCOUNTER — Other Ambulatory Visit: Payer: 59

## 2021-06-25 ENCOUNTER — Encounter: Payer: Self-pay | Admitting: Neurology

## 2021-07-07 ENCOUNTER — Other Ambulatory Visit: Payer: 59

## 2021-07-15 ENCOUNTER — Ambulatory Visit
Admission: RE | Admit: 2021-07-15 | Discharge: 2021-07-15 | Disposition: A | Discharge status: Home / Self Care | Payer: 59 | Source: Ambulatory Visit | Attending: Physician Assistant | Admitting: Physician Assistant

## 2021-07-15 ENCOUNTER — Other Ambulatory Visit: Payer: Self-pay

## 2021-07-15 DIAGNOSIS — G935 Compression of brain: Secondary | ICD-10-CM

## 2021-07-15 IMAGING — MR MR CERVICAL SPINE WO/W CM
5 of 8 series · 27 of 48 positions shown · IV contrast (multihance)
Comparison: Prior brain MRI from [DATE].

CLINICAL DATA: Follow-up examination for newly diagnosed Chiari 1
malformation.

EXAM:
MRI CERVICAL SPINE WITHOUT AND WITH CONTRAST
TECHNIQUE: Multiplanar and multiecho pulse sequences of the cervical spine, to
include the craniocervical junction and cervicothoracic junction,
were obtained without and with intravenous contrast.
CONTRAST:  15mL MULTIHANCE GADOBENATE DIMEGLUMINE 529 MG/ML IV SOLN

[Series 3: T1 · sagittal · 3.0mm · 0.41mm/px · 3 of 14 slices shown (1 of 2)]
[im 1/14]
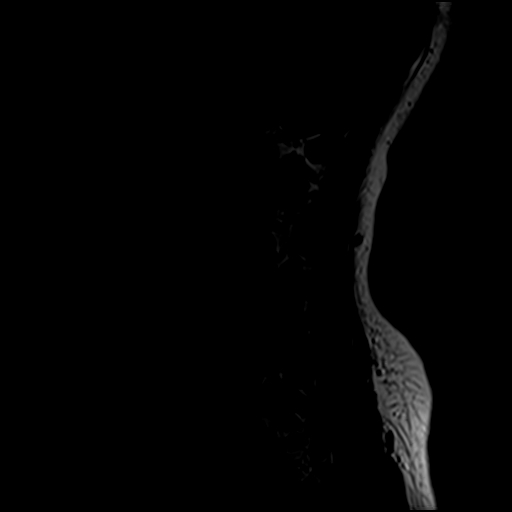
[im 7/14]
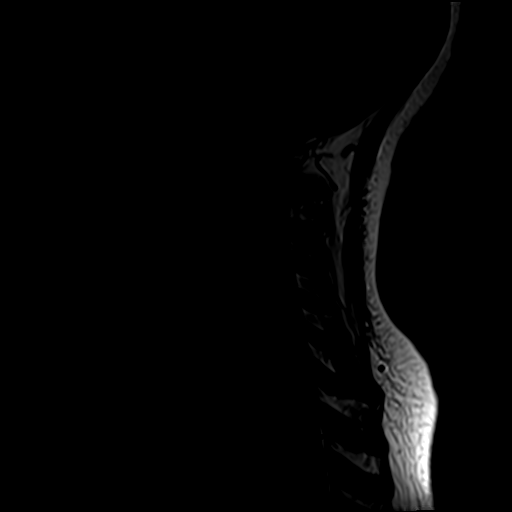
[im 14/14]
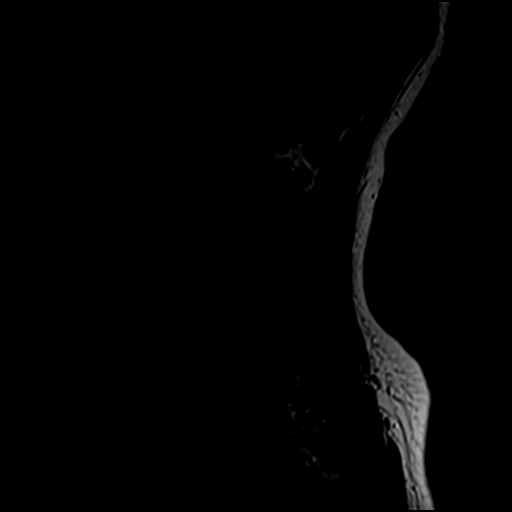

[Series 5: T2 · axial · 3.0mm · 0.70mm/px · z∈[-51,+87]mm · 9 of 38 slices shown (1 of 2)]
[im 1/38]
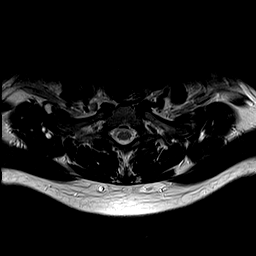
[im 5/38]
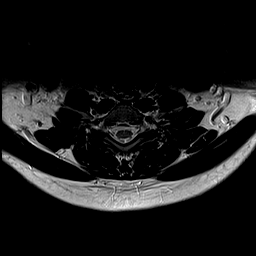
[im 10/38]
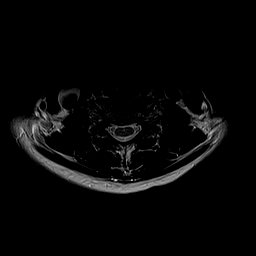
[im 14/38]
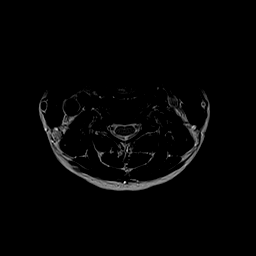
[im 19/38]
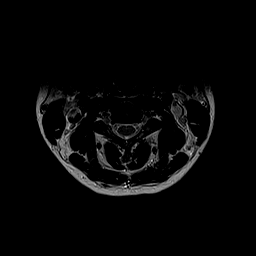
[im 24/38]
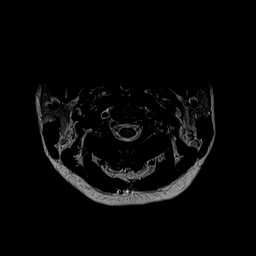
[im 28/38]
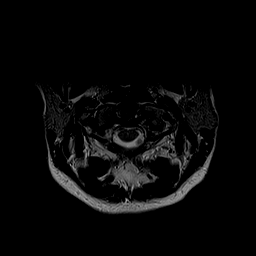
[im 33/38]
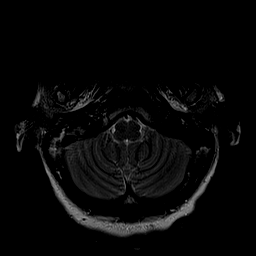
[im 38/38]
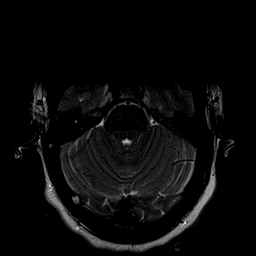

[Series 6: T1 · axial · 3.0mm · 0.35mm/px · z∈[-51,+87]mm · 9 of 38 slices shown (2 of 2)]
[im 1/38]
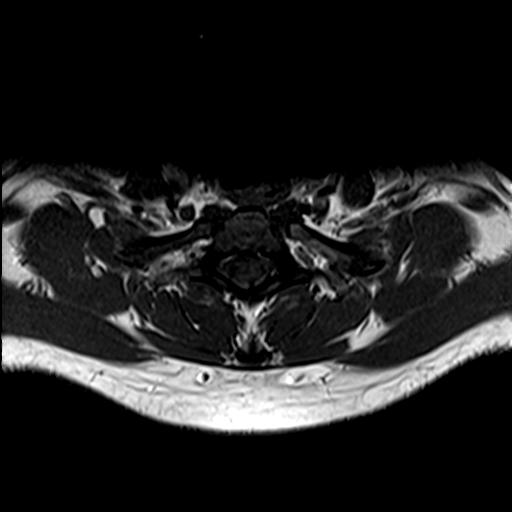
[im 5/38]
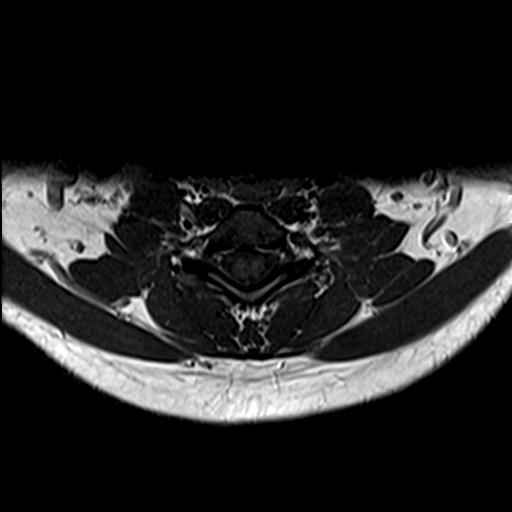
[im 10/38]
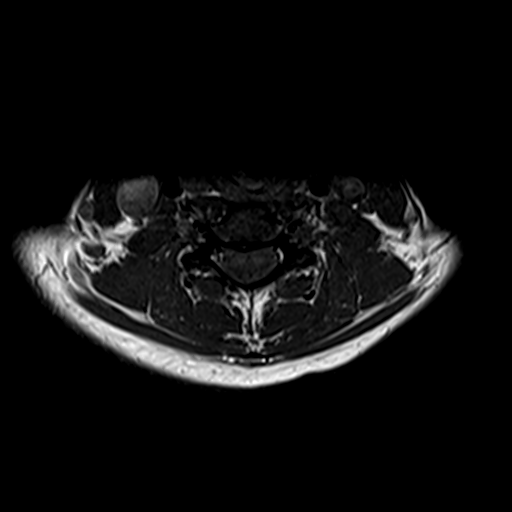
[im 14/38]
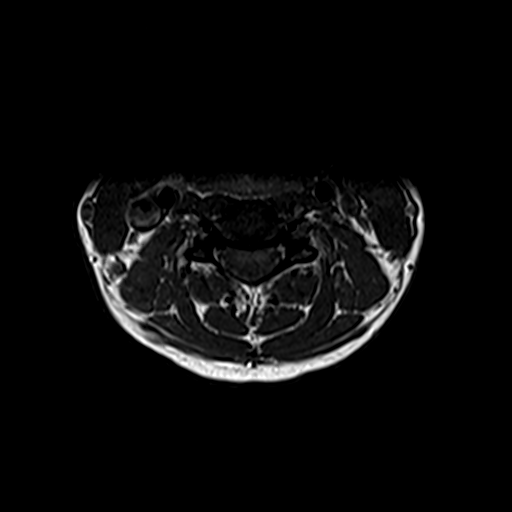
[im 19/38]
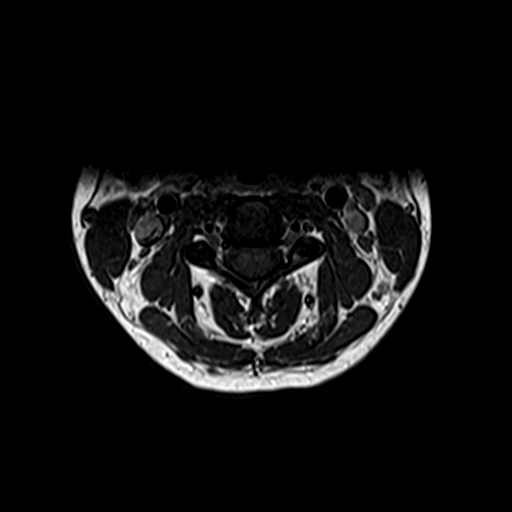
[im 24/38]
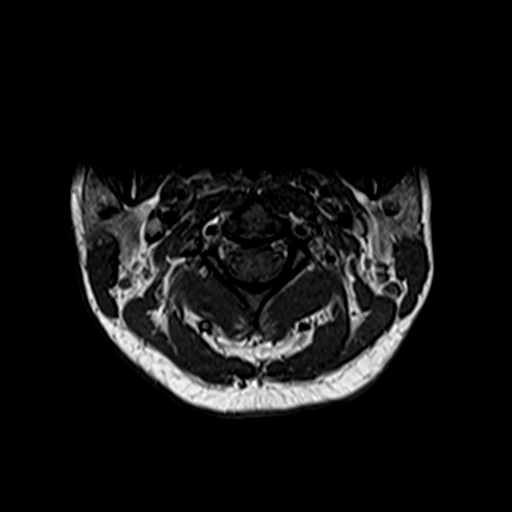
[im 28/38]
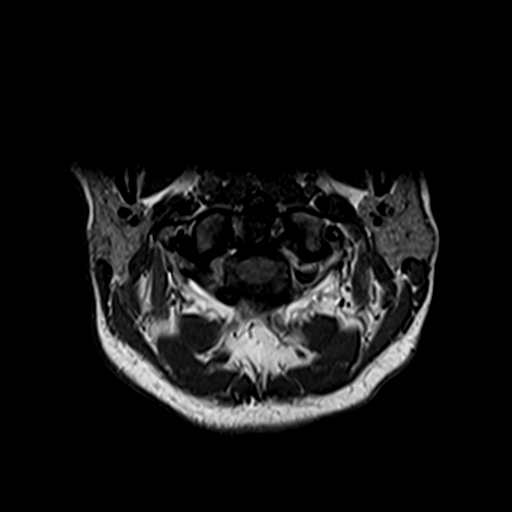
[im 33/38]
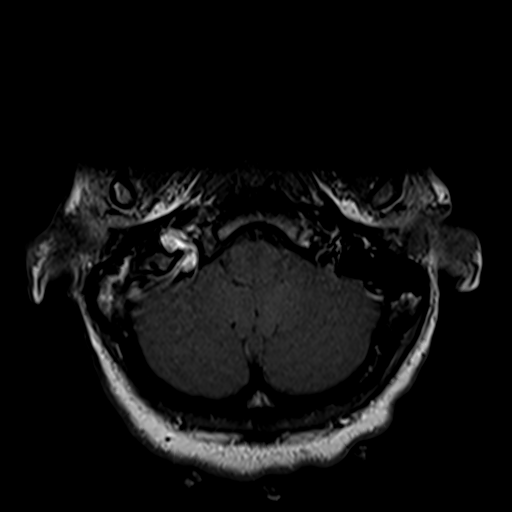
[im 38/38]
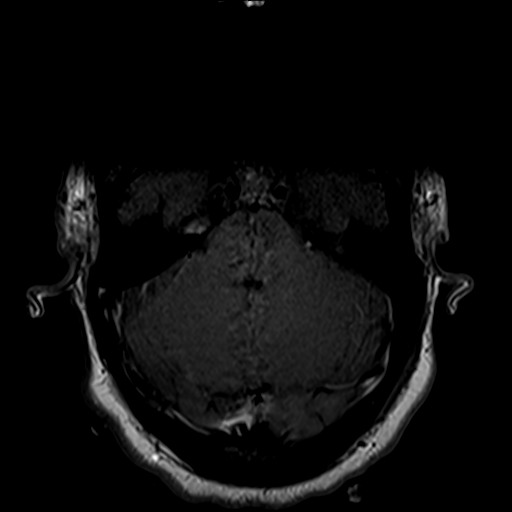

[Series 7: T2 · sagittal · 3.0mm · 0.41mm/px · 3 of 14 slices shown (2 of 2)]
[im 1/14]
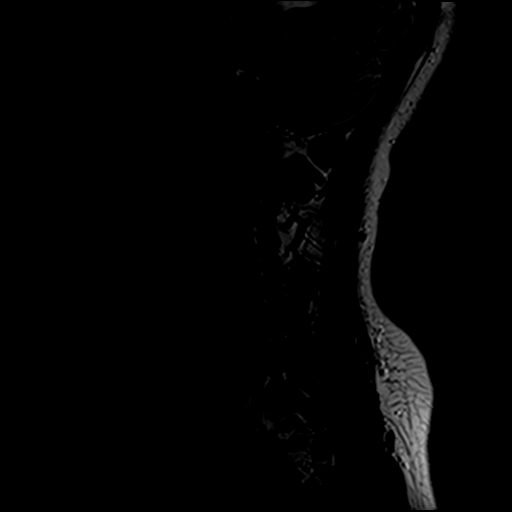
[im 7/14]
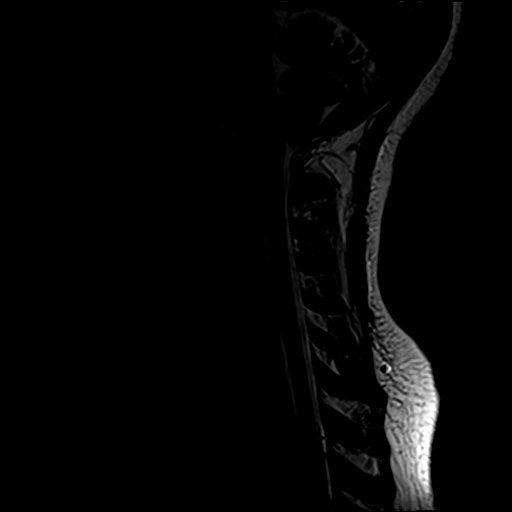
[im 14/14]
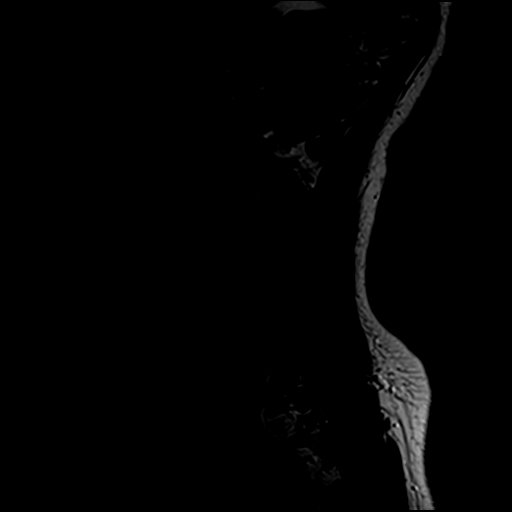

[Series 8: T1 fat-sat post-contrast · sagittal · 3.0mm · 0.82mm/px · 3 of 14 slices shown]
[im 1/14]
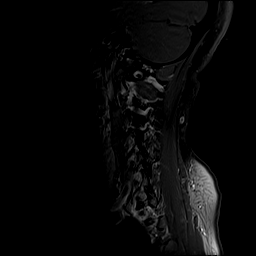
[im 7/14]
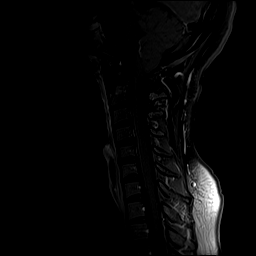
[im 14/14]
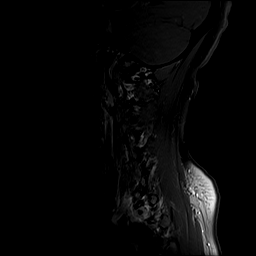

[27 of 48 positions shown; findings below may reference images not displayed]

FINDINGS: Alignment: Straightening of the normal cervical lordosis. No
listhesis.

Vertebrae: Vertebral body height well maintained without acute or
chronic fracture. Bone marrow signal intensity within normal limits.
No discrete or worrisome osseous lesions. No abnormal marrow edema
or enhancement.

Cord: Trace prominence of the central canal measuring no more than
1-2 mm noted at the level of C6-7 (series 5, image 31). No frank
syrinx. Signal intensity within the cervical spinal cord is
otherwise unremarkable and normal. Normal cord caliber and
morphology. No abnormal enhancement.

Posterior Fossa, vertebral arteries, paraspinal tissues: Chiari 1
malformation again noted at the foramen magnum. Paraspinous and
prevertebral soft tissues within normal limits. Normal flow voids
seen within the vertebral arteries bilaterally.

Disc levels:

C2-C3: Unremarkable.

C3-C4:  Unremarkable.

C4-C5: Small central disc protrusion minimally indents the ventral
thecal sac. No significant spinal stenosis. Foramina remain patent.

C5-C6:  Minimal disc bulge.  No canal or foraminal stenosis.

C6-C7:  Minimal disc bulge.  No canal or foraminal stenosis.

C7-T1:  Unremarkable.

Visualized upper thoracic spine demonstrates no significant finding.
IMPRESSION: 1. Chiari 1 malformation. Minimal prominence of the central canal
measuring no more than 1-2 mm at the level of C6-7 without frank
syrinx.
2. Otherwise normal MRI appearance of the cervical spinal cord. No
abnormal enhancement.
3. Mild noncompressive disc bulging at C4-5 through C6-7 without
significant stenosis or neural impingement.

## 2021-07-15 MED ORDER — GADOBENATE DIMEGLUMINE 529 MG/ML IV SOLN
15.0000 mL | Freq: Once | INTRAVENOUS | Status: AC | PRN
  Administered 2021-07-15: 15 mL via INTRAVENOUS

## 2021-09-13 NOTE — Progress Notes (Signed)
NEUROLOGY CONSULTATION NOTE  Tracy Hansen MRN: [id number] DOB: 02/24/1991  Referring provider: Roslynn Amble, PA Primary care provider: Aliene Beams, MD  Reason for consult:  headache, Chiari malformation  Assessment/Plan:   Positional headaches - possibly related to Chiari malformation Chiari malformation, type 1.  While the headache may be related, it is not severe and she does not exhibit any other typical symptoms associated with a symptomatic Chiari malformation.  Therefore, I don't think decompressive surgery would be indicated.  However, we will refer to neurosurgery for their opinion. Migraine without aura, without status migrainosus, not intractable (once associated with visual aura) - stable  1  Will refer to neurosurgery for their opinion.  If they don't believe surgery is an option, we can start topiramate (side effects discussed) 2  Follow up after consult   Subjective:  Tracy Hansen is a 30 year old female with asthma, hypothyroidism and iron-deficiency anemia who presents for headache and Chiari malformation.  History supplemented by referring provider's note.  MRI of brain in July and cervical spine in August personally reviewed.  She has history of migraines since she was a teenager.  They are moderate throbbing pain in the temples and across the forehead sometimes associated with nausea, vomiting, phonophobia and phonophobia.  They usually last 2 hours with Tylenol and occur 1 to 2 times a month.  Once, she saw flashing lights in the periphery of her vision followed by mild headache but otherwise no history of aura.    About a year ago, she began having positional headaches.  She notes a diffuse throbbing pain when she would bend over or turn her head quickly.  Not associated with coughing or valsalva.  Denies diplopia, facial numbness, change in hearing, extremity weakness, numbness and tingling of extremities, dizziness or balance disorder.  Sometimes feels a little  something in her throat when she swallows and sometimes has tinnitus but nothing significant.    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  MRI of cervical spine with and without contrast on 07/15/2021 showed trace prominence of the central canal measuring no more than 1-2 mm at level of C6-7 but no frank syrinx.    Current NSAIDS/analgesics:  Tylenol Current triptans:  none Current ergotamine:  none Current anti-emetic:  none Current muscle relaxants:  none Current Antihypertensive medications:  none Current Antidepressant medications:  none Current Anticonvulsant medications:  none Current anti-CGRP:  none Current Vitamins/Herbal/Supplements:  none Current Antihistamines/Decongestants:  none Other therapy:  none Hormone/birth control:  none   Past NSAIDS/analgesics:  Advil Past abortive triptans:  none Past abortive ergotamine:  none Past muscle relaxants:  none Past anti-emetic:  none Past antihypertensive medications:  none Past antidepressant medications:  none Past anticonvulsant medications:  none Past anti-CGRP:  none Past vitamins/Herbal/Supplements:  none Past antihistamines/decongestants:  none Other past therapies:  none  Caffeine:  Mt Dew, Dr. Reino Kent almost daily; coffee every once in awhile. Depression:  no; Anxiety:  no Other pain:  no Sleep hygiene:  good Family history of headache:  Aunt (migraines)  PAST MEDICAL HISTORY: Past Medical History:  Diagnosis Date   Anemia    Asthma    Hypothyroidism     PAST SURGICAL HISTORY: Past Surgical History:  Procedure Laterality Date   CESAREAN SECTION  x 4    MEDICATIONS: Current Outpatient Medications on File Prior to Visit  Medication Sig  Dispense Refill   acetaminophen (TYLENOL) 500 MG tablet Take 1,000 mg by mouth every 6 (six) hours as needed for mild pain or headache.     albuterol (VENTOLIN HFA) 108 (90 Base) MCG/ACT inhaler Inhale 1 puff into the lungs every 6 (six) hours as needed for wheezing or shortness of breath.     budesonide-formoterol (SYMBICORT) 80-4.5 MCG/ACT inhaler Inhale 1 puff into the lungs 2 (two) times daily as needed (For shortness of breath).     docusate sodium (COLACE) 100 MG capsule Take 100 mg by mouth daily as needed for mild constipation.     doxycycline (VIBRA-TABS) 100 MG tablet Take 1 tablet (100 mg total) by mouth every 12 (twelve) hours. 60 tablet 0   ferrous sulfate 325 (65 FE) MG tablet Take 325 mg by mouth daily with breakfast.     levothyroxine (SYNTHROID) 88 MCG tablet Take 88 mcg by mouth daily before breakfast.     metroNIDAZOLE (FLAGYL) 500 MG tablet Take 1 tablet (500 mg total) by mouth every 12 (twelve) hours. 60 tablet 0   Simethicone 125 MG TABS Take 1 tablet by mouth daily as needed (For gas.).     No current facility-administered medications on file prior to visit.    ALLERGIES: Allergies  Allergen Reactions   Nickel Rash   Suprax [Cefixime] Rash    FAMILY HISTORY: No family history on file.  Objective:  Blood pressure 114/75, pulse 84, resp. rate 20, height 5\' 2"  (1.575 m), weight 167 lb (75.8 kg), SpO2 100 %, not currently breastfeeding. General: No acute distress.  Patient appears well-groomed.   Head:  Normocephalic/atraumatic Eyes:  fundi examined but not visualized Neck: supple, no paraspinal tenderness, full range of motion Back: No paraspinal tenderness Heart: regular rate and rhythm Lungs: Clear to auscultation bilaterally. Vascular: No carotid bruits. Neurological Exam: Mental status: alert and oriented to person, place, and time, recent and remote memory intact, fund of knowledge intact, attention and concentration intact, speech fluent and not  dysarthric, language intact. Cranial nerves: CN I: not tested CN II: pupils equal, round and reactive to light, visual fields intact CN III, IV, VI:  full range of motion, no nystagmus, no ptosis CN V: facial sensation intact. CN VII: upper and lower face symmetric CN VIII: hearing intact CN IX, X: gag intact, uvula midline CN XI: sternocleidomastoid and trapezius muscles intact CN XII: tongue midline Bulk & Tone: normal, no fasciculations. Motor:  muscle strength 5/5 throughout Sensation:  Pinprick, temperature and vibratory sensation intact. Deep Tendon Reflexes:  2+ throughout,  toes downgoing.   Finger to nose testing:  Without dysmetria.   Heel to shin:  Without dysmetria.   Gait:  Normal station and stride.  Romberg negative.    Thank you for allowing me to take part in the care of this patient.  , DO  CC:  Shon Millet, PA-C  Roslynn Amble, MD

## 2021-09-14 ENCOUNTER — Encounter: Payer: Self-pay | Admitting: Neurology

## 2021-09-14 ENCOUNTER — Ambulatory Visit (INDEPENDENT_AMBULATORY_CARE_PROVIDER_SITE_OTHER): Payer: 59 | Admitting: Neurology

## 2021-09-14 ENCOUNTER — Other Ambulatory Visit: Payer: Self-pay

## 2021-09-14 VITALS — BP 114/75 | HR 84 | Resp 20 | Ht 62.0 in | Wt 167.0 lb

## 2021-09-14 DIAGNOSIS — G43009 Migraine without aura, not intractable, without status migrainosus: Secondary | ICD-10-CM | POA: Diagnosis not present

## 2021-09-14 DIAGNOSIS — G935 Compression of brain: Secondary | ICD-10-CM | POA: Diagnosis not present

## 2021-09-14 DIAGNOSIS — R51 Headache with orthostatic component, not elsewhere classified: Secondary | ICD-10-CM | POA: Diagnosis not present

## 2021-09-14 NOTE — Patient Instructions (Signed)
Will refer you to neurosurgery for their opinion. If surgery is not recommended, we can start a medication to help reduce headache frequency.  Follow up next available.

## 2022-01-02 NOTE — Progress Notes (Signed)
NEUROLOGY FOLLOW UP OFFICE NOTE  Tracy Hansen [id number]  Assessment/Plan:   Positional headaches - possibly related to Chiari malformation Chiari malformation, type 1.   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  Check routine CMP.  Discussed side effects such as paresthesias 2   Will obtain notes from ophthalmology 3   Follow up 4 months.     Subjective:  Tracy Hansen is a 31 year old female with asthma, hypothyroidism and iron-deficiency anemia who follows up for headache and Chiari malformation.    UPDATE: She saw neurosurgery.  They felt surgery not indicated.  Positional headaches still present with pain in back of the neck.  Notes tinnitus as well.  She reports that she saw ophthalmology a couple of weeks ago and was told that she had "fluid behind her eyes".    Current NSAIDS/analgesics:  Tylenol Current triptans:  none Current ergotamine:  none Current anti-emetic:  none Current muscle relaxants:  none Current Antihypertensive medications:  none Current Antidepressant medications:  none Current Anticonvulsant medications:  none Current anti-CGRP:  none Current Vitamins/Herbal/Supplements:  none Current Antihistamines/Decongestants:  none Other therapy:  none Hormone/birth control:  none  Caffeine:  Mt Dew, Dr. 26 almost daily; coffee every once in awhile. Depression:  no; Anxiety:  no Other pain:  no Sleep hygiene:  good   HISTORY: She has history of migraines since she was a teenager.  They are moderate throbbing pain in the temples and across the forehead sometimes associated with nausea, vomiting, phonophobia and phonophobia.  They usually last 2 hours with Tylenol and occur 1 to 2 times a month.  Once,  she saw flashing lights in the periphery of her vision followed by mild headache but otherwise no history of aura.     About a year ago, she began having positional headaches.  She notes a diffuse throbbing pain when she would bend over or turn her head quickly.  Not associated with coughing or valsalva.  Denies diplopia, facial numbness, change in hearing, extremity weakness, numbness and tingling of extremities, dizziness or balance disorder.  Sometimes feels a little something in her throat when she swallows and sometimes has tinnitus but nothing significant.     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  MRI of cervical spine with and without contrast on 07/15/2021 showed trace prominence of the central canal measuring no more than 1-2 mm at level of C6-7 but no frank syrinx.       Past NSAIDS/analgesics:  Advil Past abortive triptans:  none Past abortive ergotamine:  none Past muscle relaxants:  none Past anti-emetic:  none Past antihypertensive medications:  none Past antidepressant medications:  none Past anticonvulsant medications:  none Past anti-CGRP:  none Past vitamins/Herbal/Supplements:  none Past antihistamines/decongestants:  none Other past therapies:  none    Family history of headache:  Aunt (migraines)  PAST MEDICAL HISTORY: Past Medical History:  Diagnosis Date   Anemia    Asthma    Hypothyroidism     MEDICATIONS: Current Outpatient Medications on File Prior to Visit  Medication Sig Dispense Refill   acetaminophen (TYLENOL) 500 MG tablet Take 1,000 mg by mouth every 6 (six) hours as needed for mild pain or headache.     albuterol (VENTOLIN HFA) 108 (90 Base) MCG/ACT inhaler Inhale 1 puff into the  lungs every 6 (six) hours as needed for wheezing or shortness of breath.     budesonide-formoterol (SYMBICORT) 80-4.5 MCG/ACT inhaler Inhale 1 puff into the lungs 2 (two) times daily as needed (For shortness of breath).     docusate sodium (COLACE) 100 MG capsule Take 100 mg by mouth daily as needed for mild constipation.     doxycycline (VIBRA-TABS) 100 MG tablet Take 1 tablet (100 mg total) by mouth every 12 (twelve) hours. (Patient not taking: Reported on 09/14/2021) 60 tablet 0   ferrous sulfate 325 (65 FE) MG tablet Take 325 mg by mouth daily with breakfast.     levothyroxine (SYNTHROID) 88 MCG tablet Take 88 mcg by mouth daily before breakfast. (Patient not taking: Reported on 09/14/2021)     metroNIDAZOLE (FLAGYL) 500 MG tablet Take 1 tablet (500 mg total) by mouth every 12 (twelve) hours. (Patient not taking: Reported on 09/14/2021) 60 tablet 0   Simethicone 125 MG TABS Take 1 tablet by mouth daily as needed (For gas.). (Patient not taking: Reported on 09/14/2021)     No current facility-administered medications on file prior to visit.    ALLERGIES: Allergies  Allergen Reactions   Nickel Rash   Suprax [Cefixime] Rash    FAMILY HISTORY: No family history on file.    Objective:  Blood pressure 108/75, pulse 82, resp. rate 18, height 5\' 2"  (1.575 m), weight 174 lb (78.9 kg), SpO2 99 %. General: No acute distress.  Patient appears well-groomed.      , DO  CC: Shon Millet, MD

## 2022-01-03 ENCOUNTER — Ambulatory Visit (INDEPENDENT_AMBULATORY_CARE_PROVIDER_SITE_OTHER): Payer: 59 | Admitting: Neurology

## 2022-01-03 ENCOUNTER — Other Ambulatory Visit: Payer: Self-pay

## 2022-01-03 ENCOUNTER — Encounter: Payer: Self-pay | Admitting: Neurology

## 2022-01-03 VITALS — BP 108/75 | HR 82 | Resp 18 | Ht 62.0 in | Wt 174.0 lb

## 2022-01-03 DIAGNOSIS — R51 Headache with orthostatic component, not elsewhere classified: Secondary | ICD-10-CM

## 2022-01-03 DIAGNOSIS — G935 Compression of brain: Secondary | ICD-10-CM | POA: Diagnosis not present

## 2022-01-03 MED ORDER — ACETAZOLAMIDE 250 MG PO TABS: 250.0000 mg | ORAL_TABLET | Freq: Two times a day (BID) | ORAL | 5 refills | Status: AC

## 2022-05-07 NOTE — Progress Notes (Deleted)
NEUROLOGY FOLLOW UP OFFICE NOTE  Tracy Hansen [id number]  Assessment/Plan:   Increased intracranial pressure - possibly secondary to Chiari malformation vs idiopathic Positional headaches - possibly related to Chiari malformation Chiari malformation, type 1.   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     UPDATE: She saw ophthalmologist Dr. Zenaida Niece at [facility] on 12/20/2021.  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  Started acetazolamide 250mg  twice daily.  ***   Current NSAIDS/analgesics:  Tylenol Current triptans:  none Current ergotamine:  none Current anti-emetic:  none Current muscle relaxants:  none Current Antihypertensive medications:  none Current Antidepressant medications:  none Current Anticonvulsant medications:  none Current anti-CGRP:  none Current Vitamins/Herbal/Supplements:  none Current Antihistamines/Decongestants:  none Other therapy:  none Hormone/birth control:  none   Caffeine:  Mt Dew, Dr. almost daily; coffee every once in awhile. Depression:  no; Anxiety:  no Other pain:  no Sleep hygiene:  good   HISTORY: She has history of migraines since she was a teenager.  They are moderate throbbing pain in the temples and across the forehead sometimes associated with nausea, vomiting, phonophobia and phonophobia.  They usually last 2 hours with Tylenol and occur 1 to 2 times a month.  Once, she saw flashing lights in the periphery of her vision followed by mild headache but otherwise no history of aura.     In 2022, she began having positional headaches.  She notes a diffuse throbbing pain when she would bend over or turn her head  quickly.  Not associated with coughing or valsalva.  Denies diplopia, facial numbness, change in hearing, extremity weakness, numbness and tingling of extremities, dizziness or balance disorder.  Sometimes feels a little something in her throat when she swallows and sometimes has tinnitus but nothing significant.     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  MRI of cervical spine with and without contrast on 07/15/2021 showed trace prominence of the central canal measuring no more than 1-2 mm at level of C6-7 but no frank syrinx.  She saw neurosurgery who did not believe intervention was indicated.       Past NSAIDS/analgesics:  Advil Past abortive triptans:  none Past abortive ergotamine:  none Past muscle relaxants:  none Past anti-emetic:  none Past antihypertensive medications:  none Past antidepressant medications:  none Past anticonvulsant medications:  none Past anti-CGRP:  none Past vitamins/Herbal/Supplements:  none Past antihistamines/decongestants:  none Other past therapies:  none     Family history of headache:  Aunt (migraines)  PAST MEDICAL HISTORY: Past Medical History:  Diagnosis Date   Anemia    Asthma    Hypothyroidism     MEDICATIONS: Current Outpatient Medications on File Prior to Visit  Medication Sig Dispense Refill   acetaminophen (TYLENOL) 500 MG tablet Take 1,000 mg by mouth every 6 (six) hours as needed for mild pain or headache.     acetaZOLAMIDE (DIAMOX) 250 MG tablet Take  1 tablet (250 mg total) by mouth 2 (two) times daily. 60 tablet 5   albuterol (VENTOLIN HFA) 108 (90 Base) MCG/ACT inhaler Inhale 1 puff into the lungs every 6 (six) hours as needed for wheezing or shortness of breath.      budesonide-formoterol (SYMBICORT) 80-4.5 MCG/ACT inhaler Inhale 1 puff into the lungs 2 (two) times daily as needed (For shortness of breath).     docusate sodium (COLACE) 100 MG capsule Take 100 mg by mouth daily as needed for mild constipation.     doxycycline (VIBRA-TABS) 100 MG tablet Take 1 tablet (100 mg total) by mouth every 12 (twelve) hours. (Patient not taking: Reported on 09/14/2021) 60 tablet 0   ferrous sulfate 325 (65 FE) MG tablet Take 325 mg by mouth daily with breakfast.     levothyroxine (SYNTHROID) 88 MCG tablet Take 88 mcg by mouth daily before breakfast.     metroNIDAZOLE (FLAGYL) 500 MG tablet Take 1 tablet (500 mg total) by mouth every 12 (twelve) hours. (Patient not taking: Reported on 09/14/2021) 60 tablet 0   Simethicone 125 MG TABS Take 1 tablet by mouth daily as needed (For gas.). (Patient not taking: Reported on 09/14/2021)     No current facility-administered medications on file prior to visit.    ALLERGIES: Allergies  Allergen Reactions   Nickel Rash   Suprax [Cefixime] Rash    FAMILY HISTORY: No family history on file.    Objective:  *** General: No acute distress.  Patient appears ***-groomed.   Head:  Normocephalic/atraumatic Eyes:  Fundi examined but not visualized Neck: supple, no paraspinal tenderness, full range of motion Heart:  Regular rate and rhythm Lungs:  Clear to auscultation bilaterally Back: No paraspinal tenderness Neurological Exam: alert and oriented to person, place, and time.  Speech fluent and not dysarthric, language intact.  CN II-XII intact. Bulk and tone normal, muscle strength 5/5 throughout.  Sensation to light touch intact.  Deep tendon reflexes 2+ throughout, toes downgoing.  Finger to nose testing intact.  Gait normal, Romberg negative.   Shon Millet, DO  CC: ***

## 2022-05-08 ENCOUNTER — Encounter: Payer: Self-pay | Admitting: Neurology

## 2022-05-08 ENCOUNTER — Ambulatory Visit: Payer: 59 | Admitting: Neurology

## 2022-05-08 DIAGNOSIS — Z029 Encounter for administrative examinations, unspecified: Secondary | ICD-10-CM
